# Patient Record
Sex: Female | Born: 1940
Health system: Southern US, Community
[De-identification: ages and names within clinical notes are randomized; demographics above are authoritative.]

## PROBLEM LIST (undated history)

## (undated) DIAGNOSIS — I499 Cardiac arrhythmia, unspecified: Secondary | ICD-10-CM

## (undated) DIAGNOSIS — I493 Ventricular premature depolarization: Secondary | ICD-10-CM

## (undated) DIAGNOSIS — I340 Nonrheumatic mitral (valve) insufficiency: Secondary | ICD-10-CM

## (undated) DIAGNOSIS — E039 Hypothyroidism, unspecified: Secondary | ICD-10-CM

## (undated) DIAGNOSIS — E785 Hyperlipidemia, unspecified: Secondary | ICD-10-CM

## (undated) HISTORY — DX: Ventricular premature depolarization: I49.3

## (undated) HISTORY — DX: Hyperlipidemia, unspecified: E78.5

## (undated) HISTORY — DX: Nonrheumatic mitral (valve) insufficiency: I34.0

## (undated) HISTORY — DX: Hypothyroidism, unspecified: E03.9

---

## 1999-08-31 ENCOUNTER — Encounter: Payer: Self-pay | Admitting: Family Medicine

## 1999-08-31 ENCOUNTER — Encounter: Admission: RE | Admit: 1999-08-31 | Discharge: 1999-08-31 | Payer: Self-pay | Admitting: Family Medicine

## 2000-07-11 ENCOUNTER — Encounter: Payer: Self-pay | Admitting: Family Medicine

## 2000-07-11 ENCOUNTER — Encounter: Admission: RE | Admit: 2000-07-11 | Discharge: 2000-07-11 | Payer: Self-pay | Admitting: Family Medicine

## 2002-04-13 ENCOUNTER — Encounter: Payer: Self-pay | Admitting: Obstetrics & Gynecology

## 2002-04-13 ENCOUNTER — Encounter: Admission: RE | Admit: 2002-04-13 | Discharge: 2002-04-13 | Payer: Self-pay | Admitting: Obstetrics & Gynecology

## 2003-11-23 ENCOUNTER — Encounter: Admission: RE | Admit: 2003-11-23 | Discharge: 2003-11-23 | Payer: Self-pay | Admitting: Family Medicine

## 2005-04-02 ENCOUNTER — Encounter: Admission: RE | Admit: 2005-04-02 | Discharge: 2005-04-02 | Payer: Self-pay | Admitting: Family Medicine

## 2006-05-08 ENCOUNTER — Encounter: Admission: RE | Admit: 2006-05-08 | Discharge: 2006-05-08 | Payer: Self-pay | Admitting: Family Medicine

## 2007-09-22 ENCOUNTER — Encounter: Admission: RE | Admit: 2007-09-22 | Discharge: 2007-09-22 | Payer: Self-pay | Admitting: Family Medicine

## 2010-04-27 ENCOUNTER — Other Ambulatory Visit: Payer: Self-pay | Admitting: Family Medicine

## 2010-04-27 DIAGNOSIS — Z1231 Encounter for screening mammogram for malignant neoplasm of breast: Secondary | ICD-10-CM

## 2010-05-09 ENCOUNTER — Ambulatory Visit
Admission: RE | Admit: 2010-05-09 | Discharge: 2010-05-09 | Disposition: A | Discharge status: Home / Self Care | Payer: MEDICARE | Source: Ambulatory Visit | Attending: Family Medicine | Admitting: Family Medicine

## 2010-05-09 DIAGNOSIS — Z1231 Encounter for screening mammogram for malignant neoplasm of breast: Secondary | ICD-10-CM

## 2013-03-11 ENCOUNTER — Other Ambulatory Visit: Payer: Self-pay

## 2013-03-11 DIAGNOSIS — Z1231 Encounter for screening mammogram for malignant neoplasm of breast: Secondary | ICD-10-CM

## 2013-03-30 ENCOUNTER — Ambulatory Visit
Admission: RE | Admit: 2013-03-30 | Discharge: 2013-03-30 | Disposition: A | Discharge status: Home / Self Care | Payer: Medicare Other | Source: Ambulatory Visit

## 2013-03-30 DIAGNOSIS — Z1231 Encounter for screening mammogram for malignant neoplasm of breast: Secondary | ICD-10-CM

## 2014-08-24 ENCOUNTER — Other Ambulatory Visit: Payer: Self-pay | Admitting: Family Medicine

## 2014-08-24 DIAGNOSIS — Z1231 Encounter for screening mammogram for malignant neoplasm of breast: Secondary | ICD-10-CM

## 2014-08-24 DIAGNOSIS — M8580 Other specified disorders of bone density and structure, unspecified site: Secondary | ICD-10-CM

## 2014-08-30 ENCOUNTER — Ambulatory Visit: Payer: Self-pay

## 2014-08-30 ENCOUNTER — Other Ambulatory Visit: Payer: Self-pay

## 2014-09-13 ENCOUNTER — Ambulatory Visit
Admission: RE | Admit: 2014-09-13 | Discharge: 2014-09-13 | Disposition: A | Discharge status: Home / Self Care | Payer: Medicare Other | Source: Ambulatory Visit | Attending: Family Medicine | Admitting: Family Medicine

## 2014-09-13 DIAGNOSIS — Z1231 Encounter for screening mammogram for malignant neoplasm of breast: Secondary | ICD-10-CM

## 2014-09-13 DIAGNOSIS — M8580 Other specified disorders of bone density and structure, unspecified site: Secondary | ICD-10-CM

## 2015-06-08 ENCOUNTER — Emergency Department (HOSPITAL_COMMUNITY): Payer: Medicare Other

## 2015-06-08 ENCOUNTER — Encounter (HOSPITAL_COMMUNITY): Payer: Self-pay | Admitting: Emergency Medicine

## 2015-06-08 DIAGNOSIS — Z8679 Personal history of other diseases of the circulatory system: Secondary | ICD-10-CM | POA: Insufficient documentation

## 2015-06-08 DIAGNOSIS — R002 Palpitations: Secondary | ICD-10-CM | POA: Insufficient documentation

## 2015-06-08 LAB — BASIC METABOLIC PANEL
Anion gap: 11 (ref 5–15)
BUN: 15 mg/dL (ref 6–20)
CHLORIDE: 107 mmol/L (ref 101–111)
CO2: 22 mmol/L (ref 22–32)
CREATININE: 0.99 mg/dL (ref 0.44–1.00)
Calcium: 9.2 mg/dL (ref 8.9–10.3)
GFR, EST NON AFRICAN AMERICAN: 55 mL/min — AB (ref >60)
Glucose, Bld: 113 mg/dL — ABNORMAL HIGH (ref 65–99)
Potassium: 3.8 mmol/L (ref 3.5–5.1)
SODIUM: 140 mmol/L (ref 135–145)

## 2015-06-08 LAB — CBC
HCT: 42.7 % (ref 36.0–46.0)
Hemoglobin: 13.9 g/dL (ref 12.0–15.0)
MCH: 30.8 pg (ref 26.0–34.0)
MCHC: 32.6 g/dL (ref 30.0–36.0)
MCV: 94.5 fL (ref 78.0–100.0)
PLATELETS: 315 10*3/uL (ref 150–400)
RBC: 4.52 MIL/uL (ref 3.87–5.11)
RDW: 13.4 % (ref 11.5–15.5)
WBC: 7.3 10*3/uL (ref 4.0–10.5)

## 2015-06-08 LAB — I-STAT TROPONIN, ED: TROPONIN I, POC: 0 ng/mL (ref 0.00–0.08)

## 2015-06-08 NOTE — ED Notes (Signed)
Pt. reports palpitations / " skipping beats " with mild SOB onset today , denies chest pain , no nausea or diaphoresis .

## 2015-06-09 ENCOUNTER — Emergency Department (HOSPITAL_COMMUNITY)
Admission: EM | Admit: 2015-06-09 | Discharge: 2015-06-09 | Disposition: A | Discharge status: Home / Self Care | Payer: Medicare Other | Attending: Emergency Medicine | Admitting: Emergency Medicine

## 2015-06-09 DIAGNOSIS — R002 Palpitations: Secondary | ICD-10-CM

## 2015-06-09 HISTORY — DX: Cardiac arrhythmia, unspecified: I49.9

## 2015-06-09 LAB — MAGNESIUM: Magnesium: 2.3 mg/dL (ref 1.7–2.4)

## 2015-06-09 LAB — I-STAT TROPONIN, ED: TROPONIN I, POC: 0 ng/mL (ref 0.00–0.08)

## 2015-06-09 NOTE — ED Provider Notes (Signed)
CSN: [id number]     Arrival date & time 06/08/15  2010 History  By signing my name below, I, Doreatha Martin, attest that this documentation has been prepared under the direction and in the presence of Tomasita Crumble, MD. Electronically Signed: Doreatha Martin, ED Scribe. 06/09/2015. 1:04 AM.    Chief Complaint  Patient presents with  . Palpitations   The history is provided by the patient. No language interpreter was used.   HPI Comments: Paige Carrillo is a 75 y.o. female with h/o irregular heart rhythm, asthma who presents to the Emergency Department complaining of an episode of rapid, irregular HR that began at 4:30PM while at rest. Per pt, she is currently asymptomatic. Pt states long h/o similar episodes and has had full work up with no acute findings. She states that her symptoms were previously exacerbated by caffeine use and she no longer consumes caffeine. She states she is followed by cardiology. Denies CP, SOB, nausea, lightheadedness, back pain, arm pain, jaw pain, emesis, diarrhea, appetite change.   Past Medical History  Diagnosis Date  . Irregular heart rhythm    History reviewed. No pertinent past surgical history. No family history on file. Social History  Substance Use Topics  . Smoking status: Never Smoker   . Smokeless tobacco: None  . Alcohol Use: No   OB History    No data available     Review of Systems A complete 10 system review of systems was obtained and all systems are negative except as noted in the HPI and PMH.    Allergies  Review of patient's allergies indicates no known allergies.  Home Medications   Prior to Admission medications   Not on File   BP 143/67 mmHg  Pulse 87  Temp(Src) 98.1 F (36.7 C) (Oral)  Resp 20  Ht  (1.676 m)  Wt 174 lb 3 oz (79.011 kg)  BMI 28.13 kg/m2  SpO2 98% Physical Exam  Constitutional: She is oriented to person, place, and time. She appears well-developed and well-nourished. No distress.  HENT:  Head:  Normocephalic and atraumatic.  Nose: Nose normal.  Mouth/Throat: Oropharynx is clear and moist. No oropharyngeal exudate.  Eyes: Conjunctivae and EOM are normal. Pupils are equal, round, and reactive to light. No scleral icterus.  Neck: Normal range of motion. Neck supple. No JVD present. No tracheal deviation present. No thyromegaly present.  Cardiovascular: Normal rate, regular rhythm and normal heart sounds.  Exam reveals no gallop and no friction rub.   No murmur heard. Pulmonary/Chest: Effort normal and breath sounds normal. No respiratory distress. She has no wheezes. She exhibits no tenderness.  Abdominal: Soft. Bowel sounds are normal. She exhibits no distension and no mass. There is no tenderness. There is no rebound and no guarding.  Musculoskeletal: Normal range of motion. She exhibits no edema or tenderness.  Lymphadenopathy:    She has no cervical adenopathy.  Neurological: She is alert and oriented to person, place, and time. No cranial nerve deficit. She exhibits normal muscle tone.  Skin: Skin is warm and dry. No rash noted. No erythema. No pallor.  Nursing note and vitals reviewed.   ED Course  Procedures (including critical care time) DIAGNOSTIC STUDIES: Oxygen Saturation is 98% on RA, normal by my interpretation.    COORDINATION OF CARE: 1:03 AM Discussed treatment plan with pt at bedside which includes lab work, CXR, EKG and pt agreed to plan.   Labs Review Labs Reviewed  BASIC METABOLIC PANEL - Abnormal; Notable  for the following:    Glucose, Bld 113 (*)    GFR calc non Af Amer 55 (*)    All other components within normal limits  CBC  MAGNESIUM  I-STAT TROPOININ, ED  Rosezena SensorI-STAT TROPOININ, ED    Imaging Review Dg Chest 2 View  06/08/2015  CLINICAL DATA:  Palpitations.  Dyspnea.  Onset this afternoon. EXAM: CHEST  2 VIEW COMPARISON:  None. FINDINGS: Mild curvilinear scarring is present in the bases. The lungs are otherwise clear. The pulmonary vasculature is  normal. There are no pleural effusions. Heart size is normal. Hilar and mediastinal contours are unremarkable. IMPRESSION: No active cardiopulmonary disease. Electronically Signed   By: Ellery Plunkaniel R Mitchell M.D.   On: 06/08/2015 20:53   I have personally reviewed and evaluated these images and lab results as part of my medical decision-making.   EKG Interpretation   Date/Time:  Wednesday June 08 2015 20:24:23 EDT Ventricular Rate:  91 PR Interval:  194 QRS Duration: 74 QT Interval:  330 QTC Calculation: 405 R Axis:   34 Text Interpretation:  Sinus rhythm with Premature atrial complexes  Otherwise normal ECG Confirmed by Fayrene FearingJAMES  MD, MARK ([id number]) on 06/09/2015  12:33:48 AM      MDM   Final diagnoses:  None   Patient presents to the ED for palpitations that occurred at rest and is relieved with walking.  She has had no recurrence in her symptoms. She states it has resolved since waiting in the ED waiting room for severeal hours.  Will repeat troponin and EKG and also check magnesium.  She has an appointment in 5 days for a physical and was encouraged to bring this up.  1:18 AM K and Mg are within normal limits.  Repeat troponin and EKG are unremarkable.  PCP fu advised.  HEART score is 2 for age.  She appears well and in NAD.  Vs remain within her normal limits and she is safe for DC.   I personally performed the services described in this documentation, which was scribed in my presence. The recorded information has been reviewed and is accurate.     Tomasita CrumbleAdeleke Domanick Cuccia, MD 06/09/15 [phone]

## 2015-06-09 NOTE — Discharge Instructions (Signed)
Palpitations Paige Carrillo, your blood work and EKG did not show a cause for your palpitations.  Please see your primary care doctor during your appointment on Monday and inform them on this.  If any symptoms worsen, come back to the ED immediately. Thank you. A palpitation is the feeling that your heartbeat is irregular. It may feel like your heart is fluttering or skipping a beat. It may also feel like your heart is beating faster than normal. This is usually not a serious problem. In some cases, you may need more medical tests. HOME CARE  Avoid:  Caffeine in coffee, tea, soft drinks, diet pills, and energy drinks.  Chocolate.  Alcohol.  Stop smoking if you smoke.  Reduce your stress and anxiety. Try:  A method that measures bodily functions so you can learn to control them (biofeedback).  Yoga.  Meditation.  Physical activity such as swimming, jogging, or walking.  Get plenty of rest and sleep. GET HELP IF:  Your fast or irregular heartbeat continues after 24 hours.  Your palpitations occur more often. GET HELP RIGHT AWAY IF:   You have chest pain.  You feel short of breath.  You have a very bad headache.  You feel dizzy or pass out (faint). MAKE SURE YOU:   Understand these instructions.  Will watch your condition.  Will get help right away if you are not doing well or get worse.   This information is not intended to replace advice given to you by your health care provider. Make sure you discuss any questions you have with your health care provider.   Document Released: 11/29/2007 Document Revised: 03/12/2014 Document Reviewed: 04/20/2011 Elsevier Interactive Patient Education [employer]2016 Elsevier Inc.

## 2016-06-30 ENCOUNTER — Emergency Department (HOSPITAL_COMMUNITY)
Admission: EM | Admit: 2016-06-30 | Discharge: 2016-06-30 | Disposition: A | Discharge status: Home / Self Care | Payer: Medicare Other | Attending: Emergency Medicine | Admitting: Emergency Medicine

## 2016-06-30 ENCOUNTER — Encounter (HOSPITAL_COMMUNITY): Payer: Self-pay | Admitting: *Deleted

## 2016-06-30 DIAGNOSIS — Z79899 Other long term (current) drug therapy: Secondary | ICD-10-CM | POA: Insufficient documentation

## 2016-06-30 DIAGNOSIS — Y929 Unspecified place or not applicable: Secondary | ICD-10-CM | POA: Diagnosis not present

## 2016-06-30 DIAGNOSIS — Z23 Encounter for immunization: Secondary | ICD-10-CM | POA: Diagnosis not present

## 2016-06-30 DIAGNOSIS — Y939 Activity, unspecified: Secondary | ICD-10-CM | POA: Insufficient documentation

## 2016-06-30 DIAGNOSIS — Y999 Unspecified external cause status: Secondary | ICD-10-CM | POA: Insufficient documentation

## 2016-06-30 DIAGNOSIS — S61452A Open bite of left hand, initial encounter: Secondary | ICD-10-CM | POA: Insufficient documentation

## 2016-06-30 DIAGNOSIS — W5501XA Bitten by cat, initial encounter: Secondary | ICD-10-CM | POA: Insufficient documentation

## 2016-06-30 LAB — COMPREHENSIVE METABOLIC PANEL
ALK PHOS: 63 U/L (ref 38–126)
ALT: 20 U/L (ref 14–54)
ANION GAP: 9 (ref 5–15)
AST: 21 U/L (ref 15–41)
Albumin: 3.8 g/dL (ref 3.5–5.0)
BUN: 21 mg/dL — ABNORMAL HIGH (ref 6–20)
CALCIUM: 9.2 mg/dL (ref 8.9–10.3)
CO2: 21 mmol/L — AB (ref 22–32)
CREATININE: 0.81 mg/dL (ref 0.44–1.00)
Chloride: 106 mmol/L (ref 101–111)
GFR calc Af Amer: >60 mL/min (ref >60)
Glucose, Bld: 95 mg/dL (ref 65–99)
Potassium: 4 mmol/L (ref 3.5–5.1)
SODIUM: 136 mmol/L (ref 135–145)
TOTAL PROTEIN: 6.4 g/dL — AB (ref 6.5–8.1)
Total Bilirubin: 0.5 mg/dL (ref 0.3–1.2)

## 2016-06-30 LAB — CBC
HCT: 40.7 % (ref 36.0–46.0)
HEMOGLOBIN: 13.5 g/dL (ref 12.0–15.0)
MCH: 31.8 pg (ref 26.0–34.0)
MCHC: 33.2 g/dL (ref 30.0–36.0)
MCV: 95.8 fL (ref 78.0–100.0)
PLATELETS: 282 10*3/uL (ref 150–400)
RBC: 4.25 MIL/uL (ref 3.87–5.11)
RDW: 13.3 % (ref 11.5–15.5)
WBC: 11.9 10*3/uL — AB (ref 4.0–10.5)

## 2016-06-30 LAB — I-STAT CG4 LACTIC ACID, ED: LACTIC ACID, VENOUS: 0.63 mmol/L (ref 0.5–1.9)

## 2016-06-30 MED ORDER — AMOXICILLIN-POT CLAVULANATE 875-125 MG PO TABS: 1.0000 | ORAL_TABLET | Freq: Two times a day (BID) | ORAL | 0 refills | Status: DC

## 2016-06-30 MED ORDER — TETANUS-DIPHTH-ACELL PERTUSSIS 5-2.5-18.5 LF-MCG/0.5 IM SUSP
0.5000 mL | Freq: Once | INTRAMUSCULAR | Status: AC
  Administered 2016-06-30: 0.5 mL via INTRAMUSCULAR
  Filled 2016-06-30: qty 0.5

## 2016-06-30 MED ORDER — SODIUM CHLORIDE 0.9 % IV SOLN
3.0000 g | Freq: Once | INTRAVENOUS | Status: AC
  Administered 2016-06-30: 3 g via INTRAVENOUS
  Filled 2016-06-30: qty 3

## 2016-06-30 NOTE — ED Notes (Signed)
Denies fever, nausea, vomiting, diarrhea, etc.

## 2016-06-30 NOTE — ED Provider Notes (Signed)
[facility]-EMERGENCY DEPT Provider Note   CSN: [id number] Arrival date & time: 06/30/16  1630     History   Chief Complaint Chief Complaint  Patient presents with  . Animal Bite    HPI Paige Carrillo is a 76 y.o. female. 39 female. Was bitten by her cat last night on her left hand at the base of her thumb. Has redness extending up her arm today and presents here.  He states it is throbbing and sore. She can move her thumb and hand without difficulty. No fevers or chills. She is not diabetic. She is not immune suppressed.  HPI  Past Medical History:  Diagnosis Date  . Irregular heart rhythm     There are no active problems to display for this patient.   History reviewed. No pertinent surgical history.  OB History    No data available       Home Medications    Prior to Admission medications   Medication Sig Start Date End Date Taking? Authorizing Provider  amoxicillin-clavulanate (AUGMENTIN) 875-125 MG tablet Take 1 tablet by mouth 2 (two) times daily. 06/30/16   Rolland Porter, MD  Fluticasone-Salmeterol (ADVAIR) 250-50 MCG/DOSE AEPB Inhale 1 puff into the lungs 2 (two) times daily.    Historical Provider, MD  levothyroxine (SYNTHROID, LEVOTHROID) 112 MCG tablet Take 112 mcg by mouth daily. 03/25/15   Historical Provider, MD  PROAIR HFA 108 (90 Base) MCG/ACT inhaler Inhale 1-2 puffs into the lungs every 4 (four) hours as needed. 03/08/15   Historical Provider, MD    Family History No family history on file.  Social History Social History  Substance Use Topics  . Smoking status: Never Smoker  . Smokeless tobacco: Never Used  . Alcohol use No     Allergies   Darvon [propoxyphene]   Review of Systems Review of Systems  Constitutional: Negative for appetite change, chills, diaphoresis, fatigue and fever.  HENT: Negative for mouth sores, sore throat and trouble swallowing.   Eyes: Negative for visual disturbance.  Respiratory: Negative for cough, chest  tightness, shortness of breath and wheezing.   Cardiovascular: Negative for chest pain.  Gastrointestinal: Negative for abdominal distention, abdominal pain, diarrhea, nausea and vomiting.  Endocrine: Negative for polydipsia, polyphagia and polyuria.  Genitourinary: Negative for dysuria, frequency and hematuria.  Musculoskeletal: Negative for gait problem.  Skin: Negative for color change, pallor and rash.       Bite left hand with red streaks  Neurological: Negative for dizziness, syncope, light-headedness and headaches.  Hematological: Does not bruise/bleed easily.  Psychiatric/Behavioral: Negative for behavioral problems and confusion.     Physical Exam Updated Vital Signs BP (!) 151/70   Pulse 79   Temp 99.5 F (37.5 C) (Oral)   Resp 18   SpO2 96%   Physical Exam  Constitutional: She is oriented to person, place, and time. She appears well-developed and well-nourished. No distress.  HENT:  Head: Normocephalic.  Eyes: Conjunctivae are normal. Pupils are equal, round, and reactive to light. No scleral icterus.  Neck: Normal range of motion. Neck supple. No thyromegaly present.  Cardiovascular: Normal rate and regular rhythm.  Exam reveals no gallop and no friction rub.   No murmur heard. Pulmonary/Chest: Effort normal and breath sounds normal. No respiratory distress. She has no wheezes. She has no rales.  Abdominal: Soft. Bowel sounds are normal. She exhibits no distension. There is no tenderness. There is no rebound.  Musculoskeletal: Normal range of motion.  Exam the left hand shows puncture  wounds on the dorsum of the hand just proximal to the base of the thumb and MTP. No obvious focal soft tissue swelling to suggest abscess. There is some surrounding cellulitis for several centimeters. There is a lymphangitic streak to just a few centimeters above her left elbow. This does not extend to the axilla. She is not febrile. She is not tachycardic.  Neurological: She is alert and  oriented to person, place, and time.  Skin: Skin is warm and dry. No rash noted.  Psychiatric: She has a normal mood and affect. Her behavior is normal.     ED Treatments / Results  Labs (all labs ordered are listed, but only abnormal results are displayed) Labs Reviewed  COMPREHENSIVE METABOLIC PANEL - Abnormal; Notable for the following:       Result Value   CO2 21 (*)    BUN 21 (*)    Total Protein 6.4 (*)    All other components within normal limits  CBC - Abnormal; Notable for the following:    WBC 11.9 (*)    All other components within normal limits  I-STAT CG4 LACTIC ACID, ED  I-STAT CG4 LACTIC ACID, ED    EKG  EKG Interpretation None       Radiology No results found.  Procedures Procedures (including critical care time)  Medications Ordered in ED Medications  Ampicillin-Sulbactam (UNASYN) 3 g in sodium chloride 0.9 % 100 mL IVPB (0 g Intravenous Stopped 06/30/16 1917)  Tdap (BOOSTRIX) injection 0.5 mL (0.5 mLs Intramuscular Given 06/30/16 1836)     Initial Impression / Assessment and Plan / ED Course  I have reviewed the triage vital signs and the nursing notes.  Pertinent labs & imaging results that were available during my care of the patient were reviewed by me and considered in my medical decision making (see chart for details).     I recommended IV antibiotics. We discussed treatment at home versus hospitalization. She has a very strong desire not to be hospitalized. I think this is not unreasonable. I discussed with her the Bites can improve quickly but also can progress quickly as hers has in the first 24 hours. I'll discharge her on Augmentin and have advised her to recheck here tomorrow morning if not showing improvement or with any worsening over the course of the next 24 hours including fever or proximal spread a rather symptoms. She expresses strong understanding and agrees to return if not improving or if worse  Final Clinical Impressions(s) /  ED Diagnoses   Final diagnoses:  Cat bite, initial encounter    New Prescriptions Discharge Medication List as of 06/30/2016  7:49 PM    START taking these medications   Details  amoxicillin-clavulanate (AUGMENTIN) 875-125 MG tablet Take 1 tablet by mouth 2 (two) times daily., Starting Sat 06/30/2016, Print         Rolland Porter, MD 06/30/16 2253

## 2016-06-30 NOTE — ED Triage Notes (Signed)
The pt was bitten by her cat last pm  She had pain all night.  The bites to her lt hand wrist and a-c red swollen and a red streak has run from the hand up into  Her elbow she was [facility]

## 2016-06-30 NOTE — Discharge Instructions (Signed)
Recheck here in 24 hours if not markedly improved.  Return here at any time with worsening

## 2016-07-06 ENCOUNTER — Emergency Department (HOSPITAL_COMMUNITY): Payer: Medicare Other

## 2016-07-06 ENCOUNTER — Emergency Department (HOSPITAL_COMMUNITY)
Admission: EM | Admit: 2016-07-06 | Discharge: 2016-07-07 | Disposition: A | Discharge status: Home / Self Care | Payer: Medicare Other | Attending: Emergency Medicine | Admitting: Emergency Medicine

## 2016-07-06 ENCOUNTER — Other Ambulatory Visit: Payer: Self-pay

## 2016-07-06 ENCOUNTER — Encounter (HOSPITAL_COMMUNITY): Payer: Self-pay | Admitting: *Deleted

## 2016-07-06 DIAGNOSIS — Z79899 Other long term (current) drug therapy: Secondary | ICD-10-CM | POA: Diagnosis not present

## 2016-07-06 DIAGNOSIS — R42 Dizziness and giddiness: Secondary | ICD-10-CM | POA: Diagnosis not present

## 2016-07-06 DIAGNOSIS — R079 Chest pain, unspecified: Secondary | ICD-10-CM | POA: Diagnosis present

## 2016-07-06 DIAGNOSIS — R0789 Other chest pain: Secondary | ICD-10-CM | POA: Insufficient documentation

## 2016-07-06 LAB — BASIC METABOLIC PANEL
Anion gap: 9 (ref 5–15)
BUN: 24 mg/dL — ABNORMAL HIGH (ref 6–20)
CALCIUM: 9.6 mg/dL (ref 8.9–10.3)
CO2: 23 mmol/L (ref 22–32)
Chloride: 106 mmol/L (ref 101–111)
Creatinine, Ser: 0.75 mg/dL (ref 0.44–1.00)
GFR calc Af Amer: >60 mL/min (ref >60)
GFR calc non Af Amer: >60 mL/min (ref >60)
GLUCOSE: 107 mg/dL — AB (ref 65–99)
Potassium: 4.1 mmol/L (ref 3.5–5.1)
Sodium: 138 mmol/L (ref 135–145)

## 2016-07-06 LAB — CBC
HCT: 41.7 % (ref 36.0–46.0)
HEMOGLOBIN: 13.8 g/dL (ref 12.0–15.0)
MCH: 31.6 pg (ref 26.0–34.0)
MCHC: 33.1 g/dL (ref 30.0–36.0)
MCV: 95.4 fL (ref 78.0–100.0)
PLATELETS: 340 10*3/uL (ref 150–400)
RBC: 4.37 MIL/uL (ref 3.87–5.11)
RDW: 13 % (ref 11.5–15.5)
WBC: 9.5 10*3/uL (ref 4.0–10.5)

## 2016-07-06 LAB — I-STAT TROPONIN, ED: TROPONIN I, POC: 0 ng/mL (ref 0.00–0.08)

## 2016-07-06 NOTE — ED Provider Notes (Signed)
By signing my name below, I, Talbert Nan, attest that this documentation has been prepared under the direction and in the presence of Ward, Layla Maw, DO. Electronically Signed: Talbert Nan, Scribe. 07/06/16. 11:41 PM.  TIME SEEN: 11:26 PM  CHIEF COMPLAINT: Chest pain  HPI: Paige Carrillo is a 76 y.o. female with h/o asthma, GERD, HLD who presents to the Emergency Department complaining of sudden onset, "wrong" feeling, moderate, resolved right sided chest pain that lasted 45 seconds at 5:45 pm today. Pt had associated dizziness she describes as a lightheadedness that resolved immediately. Pt has taken 2 ASA and antacids PTA with relief. Pt denies that anything exacerbated the pain. Pt states that she had not eaten anything prior to the pain. Pt denies "crushing" pain, SOB, nausea, vomiting, fever. Pt has a cat bite that is healing to the left hand and wrist that she was seen for on 06/30/16 and is on antibiotics. Pt has no h/o cardiac problems, but has had a stress test. Pt has no h/o blood clots in extremities or lungs. 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 Pt wet to cardiologist in April 2018 and was given "clean bill of health". Pt has been told to follow up with PCP next week. Pt states that she is feeling fine and is not is any pain at the moment. Pt is able to exercise (walks up to 3 hours) every day without being symptomatic.  ROS: See HPI Constitutional: no fever  Eyes: no drainage  ENT: no runny nose   Cardiovascular:  chest pain  Resp: no SOB  GI: no vomiting GU: no dysuria Integumentary: no rash  Allergy: no hives  Musculoskeletal: no leg swelling  Neurological: no slurred speech ROS otherwise negative  PAST MEDICAL HISTORY/PAST SURGICAL HISTORY:  Past Medical History:  Diagnosis Date  . Irregular heart rhythm     MEDICATIONS:  Prior to Admission medications   Medication Sig Start Date End Date Taking? Authorizing Provider  amoxicillin-clavulanate  (AUGMENTIN) 875-125 MG tablet Take 1 tablet by mouth 2 (two) times daily. 06/30/16   Rolland Porter, MD  Fluticasone-Salmeterol (ADVAIR) 250-50 MCG/DOSE AEPB Inhale 1 puff into the lungs 2 (two) times daily.    [provider]  levothyroxine (SYNTHROID, LEVOTHROID) 112 MCG tablet Take 112 mcg by mouth daily. 03/25/15   [provider]  PROAIR HFA 108 (90 Base) MCG/ACT inhaler Inhale 1-2 puffs into the lungs every 4 (four) hours as needed. 03/08/15   [provider]    ALLERGIES:  Allergies  Allergen Reactions  . Darvon [Propoxyphene] Other (See Comments)    "Felt weird"     SOCIAL HISTORY:  Social History  Substance Use Topics  . Smoking status: Never Smoker  . Smokeless tobacco: Never Used  . Alcohol use No    FAMILY HISTORY: No family history on file.  EXAM: BP (!) 152/60   Pulse 67   Temp 97.7 F (36.5 C) (Oral)   Resp 14   Ht 5\' 6"  (1.676 m)   Wt 179 lb (81.2 kg)   SpO2 100%   BMI 28.89 kg/m  CONSTITUTIONAL: Alert and oriented and responds appropriately to questions. Well-appearing; well-nourished HEAD: Normocephalic EYES: Conjunctivae clear, pupils appear equal, EOMI ENT: normal nose; moist mucous membranes NECK: Supple, no meningismus, no nuchal rigidity, no LAD  CARD: RRR; S1 and S2 appreciated; no murmurs, no clicks, no rubs, no gallops RESP: Normal chest excursion without splinting or tachypnea; breath sounds clear and equal bilaterally; no  wheezes, no rhonchi, no rales, no hypoxia or respiratory distress, speaking full sentences ABD/GI: Normal bowel sounds; non-distended; soft, non-tender, no rebound, no guarding, no peritoneal signs, no hepatosplenomegaly BACK:  The back appears normal and is non-tender to palpation, there is no CVA tenderness EXT: Normal ROM in all joints; non-tender to palpation; no edema; normal capillary refill; no cyanosis, no calf tenderness or swelling    SKIN: Normal color for age and race; warm; no rash NEURO:  Moves all extremities equally PSYCH: The patient's mood and manner are appropriate. Grooming and personal hygiene are appropriate.  MEDICAL DECISION MAKING: Patient here with atypical chest pain. Lasted for 45 seconds and then resolved. Had mild associated lightheadedness but states she has had this before. First set of labs unremarkable with negative troponin. Chest x-ray clear.  HEART score is 3.  Patient still asymptomatic. Plan is to repeat second troponin and reassess. Doubt dissection, PE given patient is symptomatically currently, hemodynamically stable. Patient's first EKG shows no ischemic abnormality at 6:58 PM.  ED PROGRESS: 12:10 AM  Pt is still asymptomatic. Second troponin negative. I feel she is safe for discharge home with close outpatient follow-up with her primary care provider. Patient comfortable with this plan.  At this time, I do not feel there is any life-threatening condition present. I have reviewed and discussed all results (EKG, imaging, lab, urine as appropriate) and exam findings with patient/family. I have reviewed nursing notes and appropriate previous records.  I feel the patient is safe to be discharged home without further emergent workup and can continue workup as an outpatient as needed. Discussed usual and customary return precautions. Patient/family verbalize understanding and are comfortable with this plan.  Outpatient follow-up has been provided if needed. All questions have been answered.    EKG Interpretation  Date/Time:  Friday Jul 06 2016 23:53:43 EDT Ventricular Rate:  61 PR Interval:    QRS Duration: 91 QT Interval:  393 QTC Calculation: 396 R Axis:   67 Text Interpretation:  Sinus rhythm Borderline prolonged PR interval Borderline T abnormalities, anterior leads No significant change since last tracing Confirmed by WARD,  DO, KRISTEN [phone] on 07/06/2016 11:56:44 PM        I personally performed the services described in this documentation, which  was scribed in my presence. The recorded information has been reviewed and is accurate.     Ward, Layla MawKristen N, DO 07/07/16 [phone]

## 2016-07-06 NOTE — ED Triage Notes (Signed)
States she was sitting in living room when she felt a sharp pain in right chest that moved through to her back. States it lasted 30-40 seconds. She took some antacids and asa and walked around her house. She noticed herself feeling dizzy so [facility] to [facility]. No pain or discomfort now. States she was in ED last weekend due to a cat bite after noticing red streaks up her arm. States she has had IV abx and 'heavy doses' of abx at home. Appears in nad. No sob.

## 2016-07-07 LAB — I-STAT TROPONIN, ED: Troponin i, poc: 0 ng/mL (ref 0.00–0.08)

## 2016-07-07 NOTE — Discharge Instructions (Signed)
Please schedule an appointment with your primary care physician at the beginning of next week.

## 2016-07-07 NOTE — ED Notes (Signed)
PT states understanding of care given. PT ambulated from ED to car with a steady gait. 

## 2016-10-26 ENCOUNTER — Other Ambulatory Visit: Payer: Self-pay | Admitting: Physician Assistant

## 2016-10-26 DIAGNOSIS — Z1231 Encounter for screening mammogram for malignant neoplasm of breast: Secondary | ICD-10-CM

## 2016-10-26 DIAGNOSIS — M8580 Other specified disorders of bone density and structure, unspecified site: Secondary | ICD-10-CM

## 2016-11-07 ENCOUNTER — Ambulatory Visit
Admission: RE | Admit: 2016-11-07 | Discharge: 2016-11-07 | Disposition: A | Discharge status: Home / Self Care | Payer: Medicare Other | Source: Ambulatory Visit | Attending: Physician Assistant | Admitting: Physician Assistant

## 2016-11-07 DIAGNOSIS — M8580 Other specified disorders of bone density and structure, unspecified site: Secondary | ICD-10-CM

## 2016-11-07 DIAGNOSIS — Z1231 Encounter for screening mammogram for malignant neoplasm of breast: Secondary | ICD-10-CM

## 2017-03-27 ENCOUNTER — Emergency Department (HOSPITAL_COMMUNITY)
Admission: EM | Admit: 2017-03-27 | Discharge: 2017-03-27 | Disposition: A | Discharge status: Home / Self Care | Payer: PPO | Attending: Emergency Medicine | Admitting: Emergency Medicine

## 2017-03-27 ENCOUNTER — Other Ambulatory Visit: Payer: Self-pay

## 2017-03-27 ENCOUNTER — Emergency Department (HOSPITAL_COMMUNITY): Payer: PPO

## 2017-03-27 ENCOUNTER — Encounter (HOSPITAL_COMMUNITY): Payer: Self-pay | Admitting: Emergency Medicine

## 2017-03-27 DIAGNOSIS — E785 Hyperlipidemia, unspecified: Secondary | ICD-10-CM | POA: Diagnosis not present

## 2017-03-27 DIAGNOSIS — R0789 Other chest pain: Secondary | ICD-10-CM | POA: Diagnosis not present

## 2017-03-27 DIAGNOSIS — Z79899 Other long term (current) drug therapy: Secondary | ICD-10-CM | POA: Insufficient documentation

## 2017-03-27 DIAGNOSIS — K219 Gastro-esophageal reflux disease without esophagitis: Secondary | ICD-10-CM | POA: Diagnosis not present

## 2017-03-27 DIAGNOSIS — J989 Respiratory disorder, unspecified: Secondary | ICD-10-CM | POA: Diagnosis not present

## 2017-03-27 DIAGNOSIS — R072 Precordial pain: Secondary | ICD-10-CM | POA: Diagnosis not present

## 2017-03-27 DIAGNOSIS — Z7982 Long term (current) use of aspirin: Secondary | ICD-10-CM | POA: Insufficient documentation

## 2017-03-27 DIAGNOSIS — R42 Dizziness and giddiness: Secondary | ICD-10-CM | POA: Diagnosis not present

## 2017-03-27 DIAGNOSIS — R079 Chest pain, unspecified: Secondary | ICD-10-CM | POA: Insufficient documentation

## 2017-03-27 LAB — BASIC METABOLIC PANEL
ANION GAP: 11 (ref 5–15)
BUN: 13 mg/dL (ref 6–20)
CALCIUM: 9.4 mg/dL (ref 8.9–10.3)
CO2: 22 mmol/L (ref 22–32)
Chloride: 106 mmol/L (ref 101–111)
Creatinine, Ser: 0.9 mg/dL (ref 0.44–1.00)
GLUCOSE: 122 mg/dL — AB (ref 65–99)
POTASSIUM: 4 mmol/L (ref 3.5–5.1)
SODIUM: 139 mmol/L (ref 135–145)

## 2017-03-27 LAB — CBC
HEMATOCRIT: 43.4 % (ref 36.0–46.0)
HEMOGLOBIN: 14.4 g/dL (ref 12.0–15.0)
MCH: 32 pg (ref 26.0–34.0)
MCHC: 33.2 g/dL (ref 30.0–36.0)
MCV: 96.4 fL (ref 78.0–100.0)
Platelets: 295 10*3/uL (ref 150–400)
RBC: 4.5 MIL/uL (ref 3.87–5.11)
RDW: 13 % (ref 11.5–15.5)
WBC: 6.9 10*3/uL (ref 4.0–10.5)

## 2017-03-27 LAB — I-STAT TROPONIN, ED: TROPONIN I, POC: 0.02 ng/mL (ref 0.00–0.08)

## 2017-03-27 MED ORDER — NITROGLYCERIN 0.4 MG SL SUBL: 0.4000 mg | SUBLINGUAL_TABLET | SUBLINGUAL | 0 refills | Status: AC | PRN

## 2017-03-27 NOTE — Discharge Instructions (Signed)
Your workup today did not definitively reveal a cardiac cause of your chest pain however, we did call cardiology.  Dr. Jacinto HalimGanji feel you are safe to go home with close outpatient follow-up as well as a prescription for nitroglycerin.  If you begin having any worsened symptoms, please return immediately to the nearest emergency department.  Please follow-up as directed.

## 2017-03-27 NOTE — H&P (Signed)
Paige Carrillo is an 77 y.o. female.   Chief Complaint: Chest pain HPI: Patient with chronic palpitations, has been on low-dose beta-blockers 3-4 years, hyperlipidemia, who has had a event 3 years ago and was found to have occasional PVCs has had a negative routine treadmill stress test in 2016.  She had been doing well until this morning around 10 AM, when walking the dog, she felt chest discomfort she describes as very mild in the left breast area.  It lasted for about 5 minutes with complete resolution.  Again she has had about 2-3 episodes without any exertional activity at home.  Due to this she just wanted to get it checked out and went to the urgent care and then she was advised to go to the emergency room.  Patient states that by the time she was evaluated around noon, she had complete resolution of symptoms, she got ready without any discomfort, and came to the emergency room.  She has not had any recurrence since then.  States the chest pain was very mild and describes it as discomfort and states that if she was busy she probably would not evaluate his no other associated symptoms, denies any shortness of breath, palpitations with the chest pain, dizziness or syncope.  She denies any leg edema.  She is very active.  Past Medical History:  Diagnosis Date  . Irregular heart rhythm     History reviewed. No pertinent surgical history.  No family history on file. Social History:  reports that  has never smoked. she has never used smokeless tobacco. She reports that she does not drink alcohol or use drugs.  Allergies:  Allergies  Allergen Reactions  . Darvon [Propoxyphene] Other (See Comments)    "Felt weird"    Results for orders placed or performed during the hospital encounter of 03/27/17 (from the past 48 hour(s))  Basic metabolic panel     Status: Abnormal   Collection Time: 03/27/17  3:18 PM  Result Value Ref Range   Sodium 139 135 - 145 mmol/L   Potassium 4.0 3.5 - 5.1  mmol/L   Chloride 106 101 - 111 mmol/L   CO2 22 22 - 32 mmol/L   Glucose, Bld 122 (H) 65 - 99 mg/dL   BUN 13 6 - 20 mg/dL   Creatinine, Ser 0.90 0.44 - 1.00 mg/dL   Calcium 9.4 8.9 - 10.3 mg/dL   GFR calc non Af Amer >60 >60 mL/min   GFR calc Af Amer >60 >60 mL/min    Comment: (NOTE) The eGFR has been calculated using the CKD EPI equation. This calculation has not been validated in all clinical situations. eGFR's persistently <60 mL/min signify possible Chronic Kidney Disease.    Anion gap 11 5 - 15  CBC     Status: None   Collection Time: 03/27/17  3:18 PM  Result Value Ref Range   WBC 6.9 4.0 - 10.5 K/uL   RBC 4.50 3.87 - 5.11 MIL/uL   Hemoglobin 14.4 12.0 - 15.0 g/dL   HCT 43.4 36.0 - 46.0 %   MCV 96.4 78.0 - 100.0 fL   MCH 32.0 26.0 - 34.0 pg   MCHC 33.2 30.0 - 36.0 g/dL   RDW 13.0 11.5 - 15.5 %   Platelets 295 150 - 400 K/uL  I-stat troponin, ED     Status: None   Collection Time: 03/27/17  3:35 PM  Result Value Ref Range   Troponin i, poc 0.02 0.00 - 0.08 ng/mL  Comment 3            Comment: Due to the release kinetics of cTnI, a negative result within the first hours of the onset of symptoms does not rule out myocardial infarction with certainty. If myocardial infarction is still suspected, repeat the test at appropriate intervals.    Dg Chest 2 View  Result Date: 03/27/2017 CLINICAL DATA:  Chest pain. EXAM: CHEST  2 VIEW COMPARISON:  07/06/2016 FINDINGS: The heart size and mediastinal contours are within normal limits. Both lungs are clear. The visualized skeletal structures are unremarkable. Aortic atherosclerosis. IMPRESSION: No active cardiopulmonary disease. Aortic Atherosclerosis (ICD10-I70.0). Electronically Signed   By: Lorriane Shire M.D.   On: 03/27/2017 16:31    Review of Systems  Constitutional: Negative.   HENT: Negative.   Eyes: Negative.   Respiratory: Negative.   Cardiovascular: Positive for chest pain. Negative for palpitations, orthopnea,  claudication, leg swelling and PND.  Gastrointestinal: Positive for heartburn (Occasional GERD).  Genitourinary: Negative.   Musculoskeletal: Positive for joint pain (arthritis).  Skin: Negative.   Neurological: Negative.   Endo/Heme/Allergies: Negative.   Psychiatric/Behavioral: Negative.   All other systems reviewed and are negative.   Blood pressure 137/62, pulse 85, temperature 98.3 F (36.8 C), temperature source Oral, resp. rate 18, SpO2 100 %. Physical Exam  Constitutional: She is oriented to person, place, and time. She appears well-developed and well-nourished. No distress.  HENT:  Head: Normocephalic and atraumatic.  Eyes: Conjunctivae are normal.  Neck: Normal range of motion. Neck supple.  Cardiovascular: Normal rate, regular rhythm, normal heart sounds and intact distal pulses. Exam reveals no gallop and no friction rub.  No murmur heard. Respiratory: Effort normal and breath sounds normal.  GI: Soft. Bowel sounds are normal.  Musculoskeletal: Normal range of motion. She exhibits no edema or deformity.  Neurological: She is alert and oriented to person, place, and time.  Skin: Skin is warm and dry. She is not diaphoretic.  Psychiatric: She has a normal mood and affect.     No current facility-administered medications on file prior to encounter.    Current Outpatient Medications on File Prior to Encounter  Medication Sig Dispense Refill  . amoxicillin-clavulanate (AUGMENTIN) 875-125 MG tablet Take 1 tablet by mouth 2 (two) times daily. 14 tablet 0  . aspirin EC 81 MG tablet Take 81 mg by mouth daily.    . cetirizine (ZYRTEC) 10 MG tablet Take 10 mg by mouth daily.    . famotidine (PEPCID) 20 MG tablet Take 20 mg by mouth as needed for heartburn or indigestion.    . Fluticasone-Salmeterol (ADVAIR) 250-50 MCG/DOSE AEPB Inhale 1 puff into the lungs 2 (two) times daily.    Marland Kitchen levothyroxine (SYNTHROID, LEVOTHROID) 112 MCG tablet Take 112 mcg by mouth daily.  0  .  metoprolol succinate (TOPROL-XL) 25 MG 24 hr tablet Take 25 mg by mouth daily.  6  . pravastatin (PRAVACHOL) 40 MG tablet Take 40 mg by mouth daily.    Marland Kitchen PROAIR HFA 108 (90 Base) MCG/ACT inhaler Inhale 1-2 puffs into the lungs every 4 (four) hours as needed.  0    Outpatient  echocardiogram. 05/11/2014 1. Left ventricle cavity is normal in size. Normal global wall motion. Doppler evidence of grade I (impaired) diastolic dysfunction. Calculated EF 57%. 2. Mild mitral regurgitation. Mild calcification of the mitral valve annulus. 3. Trace tricuspid regurgitation. 4. Insignificant pericardial effusion. Treadmill stress test. 05/14/2014 Indication: Dyspnea, abnormal EKG The patient exercised according to Bruce Protocol, Total  time recorded _ 4:47 min achieving max heart rate of 143- which was 96% of MPHR for age and 7.05 METS of work Normal BP response. During exercise there was no ST-T changes of ischemia. Rare PVC noted. Normal BP response.  Treadmill Exercise stress 05/14/2014: Indication: Dyspnea, abnormal EKG The patient exercised according to Bruce Protocol, Total time recorded _ 4:47 min achieving max heart rate of 143- which was 96% of MPHR for age and 7.05 METS of work Normal BP response. During exercise there was no ST-T changes of ischemia. Rare PVC noted. Normal BP response.  Assessment/Plan 1.  Atypical chest pain, no recurrence of chest pain, patient ambulated in the hallway for 10 minutes in the emergency room without any arrhythmias or recurrence of chest pain or any other symptoms. 2.  Hyperlipidemia presently on statin 3.  History of reactive airway disease, no active exacerbation presently. 4.  History of GERD, no recent exacerbation.  Recommendation: Patient's heart score is low, completely normal EKG, no recurrence of chest pain, hence she can be discharged home and can have outpatient evaluation.  I would recommend that she use nitroglycerin and I have advised  her how to use it.  Daughter will stay with her tonight.  She is advised not to get exposed to cold air tomorrow and will make arrangements for stress testing.  She is also given option of staying tonight all coming back to the emergency room if she has recurrence of chest pain.  She has my contact for our office.  She is agreeable.  She will continue with aspirin and also Pepcid.  Adrian Prows, MD 03/27/2017, 6:37 PM

## 2017-03-27 NOTE — ED Notes (Signed)
Pt ambulated on the hallway with steady gait, no distress noticed not c/o pain.

## 2017-03-27 NOTE — ED Provider Notes (Signed)
[facility] EMERGENCY DEPARTMENT Provider Note   CSN: [id number] Arrival date & time: 03/27/17  1508     History   Chief Complaint Chief Complaint  Patient presents with  . Chest Pain    HPI Paige Carrillo is a 77 y.o. female.  The history is provided by the patient and medical records. No language interpreter was used.  Chest Pain   This is a new problem. The current episode started 6 to 12 hours ago. The problem occurs hourly. The problem has been resolved. The pain is present in the substernal region and lateral region. The pain is at a severity of 3/10. The pain is mild. The quality of the pain is described as heavy and pressure-like. The pain radiates to the left shoulder and left arm. Duration of episode(s) is 10 minutes. Pertinent negatives include no abdominal pain, no back pain, no cough, no diaphoresis, no dizziness, no exertional chest pressure, no fever, no headaches, no irregular heartbeat, no leg pain, no lower extremity edema, no malaise/fatigue, no nausea, no near-syncope, no palpitations, no shortness of breath, no sputum production, no syncope, no vomiting and no weakness. She has tried nothing for the symptoms. The treatment provided no relief.    Past Medical History:  Diagnosis Date  . Irregular heart rhythm     There are no active problems to display for this patient.   History reviewed. No pertinent surgical history.  OB History    No data available       Home Medications    Prior to Admission medications   Medication Sig Start Date End Date Taking? Authorizing Provider  amoxicillin-clavulanate (AUGMENTIN) 875-125 MG tablet Take 1 tablet by mouth 2 (two) times daily. 06/30/16   Rolland Porter, MD  aspirin EC 81 MG tablet Take 81 mg by mouth daily.    [provider]  cetirizine (ZYRTEC) 10 MG tablet Take 10 mg by mouth daily.    [provider]  famotidine (PEPCID) 20 MG tablet Take 20 mg by mouth as needed  for heartburn or indigestion.    [provider]  Fluticasone-Salmeterol (ADVAIR) 250-50 MCG/DOSE AEPB Inhale 1 puff into the lungs 2 (two) times daily.    [provider]  levothyroxine (SYNTHROID, LEVOTHROID) 112 MCG tablet Take 112 mcg by mouth daily. 03/25/15   [provider]  metoprolol succinate (TOPROL-XL) 25 MG 24 hr tablet Take 25 mg by mouth daily. 06/24/16   [provider]  pravastatin (PRAVACHOL) 40 MG tablet Take 40 mg by mouth daily. 03/31/16   [provider]  PROAIR HFA 108 (90 Base) MCG/ACT inhaler Inhale 1-2 puffs into the lungs every 4 (four) hours as needed. 03/08/15   [provider]    Family History No family history on file.  Social History Social History   Tobacco Use  . Smoking status: Never Smoker  . Smokeless tobacco: Never Used  Substance Use Topics  . Alcohol use: No  . Drug use: No     Allergies   Darvon [propoxyphene]   Review of Systems Review of Systems  Constitutional: Negative for chills, diaphoresis, fatigue, fever and malaise/fatigue.  Respiratory: Negative for cough, sputum production, chest tightness, shortness of breath, wheezing and stridor.   Cardiovascular: Positive for chest pain. Negative for palpitations, leg swelling, syncope and near-syncope.  Gastrointestinal: Negative for abdominal pain, diarrhea, nausea and vomiting.  Genitourinary: Negative for dysuria, flank pain and frequency.  Musculoskeletal: Negative for back pain, neck pain and neck  stiffness.  Skin: Negative for wound.  Neurological: Positive for light-headedness. Negative for dizziness, weakness and headaches.  Psychiatric/Behavioral: Negative for agitation and confusion.  All other systems reviewed and are negative.    Physical Exam Updated Vital Signs BP (!) 122/42 (BP Location: Right Arm)   Pulse 85   Temp 98.3 F (36.8 C) (Oral)   Resp 16   SpO2 100%   Physical Exam  Constitutional: She is oriented to  person, place, and time. She appears well-developed and well-nourished.  Non-toxic appearance. She does not appear ill. No distress.  HENT:  Head: Normocephalic.  Mouth/Throat: Oropharynx is clear and moist. No oropharyngeal exudate.  Eyes: Conjunctivae and EOM are normal. Pupils are equal, round, and reactive to light.  Neck: Normal range of motion.  Cardiovascular: Normal rate and intact distal pulses.  No murmur heard. Pulmonary/Chest: No stridor. No respiratory distress. She has no wheezes. She has no rales. She exhibits no tenderness.  Abdominal: There is no tenderness. There is no guarding.  Musculoskeletal: She exhibits no edema or tenderness.  Neurological: She is alert and oriented to person, place, and time. No sensory deficit. She exhibits normal muscle tone.  Skin: Capillary refill takes less than 2 seconds. No rash noted. She is not diaphoretic. No erythema.  Psychiatric: She has a normal mood and affect.  Nursing note and vitals reviewed.    ED Treatments / Results  Labs (all labs ordered are listed, but only abnormal results are displayed) Labs Reviewed  BASIC METABOLIC PANEL - Abnormal; Notable for the following components:      Result Value   Glucose, Bld 122 (*)    All other components within normal limits  CBC  I-STAT TROPONIN, ED    EKG  EKG Interpretation  Date/Time:  Wednesday March 27 2017 15:13:54 EST Ventricular Rate:  86 PR Interval:  178 QRS Duration: 82 QT Interval:  342 QTC Calculation: 409 R Axis:   29 Text Interpretation:  Normal sinus rhythm Normal ECG When compared to prior, no significant changes seen.  No STEMI Confirmed by Theda Belfast ([id number]) on 03/27/2017 4:08:55 PM       Radiology Dg Chest 2 View  Result Date: 03/27/2017 CLINICAL DATA:  Chest pain. EXAM: CHEST  2 VIEW COMPARISON:  07/06/2016 FINDINGS: The heart size and mediastinal contours are within normal limits. Both lungs are clear. The visualized skeletal structures are  unremarkable. Aortic atherosclerosis. IMPRESSION: No active cardiopulmonary disease. Aortic Atherosclerosis (ICD10-I70.0). Electronically Signed   By: Francene Boyers M.D.   On: 03/27/2017 16:31    Procedures Procedures (including critical care time)  Medications Ordered in ED Medications - No data to display   Initial Impression / Assessment and Plan / ED Course  I have reviewed the triage vital signs and the nursing notes.  Pertinent labs & imaging results that were available during my care of the patient were reviewed by me and considered in my medical decision making (see chart for details).     Paige Carrillo is a 77 y.o. female past medical history significant for prior cardiac arrhythmia on metoprolol who presents for chest pain.  Patient reports that at 10 AM this morning, after having a long walk of her dog, she began having some chest pressure.  She describes it as left-sided and radiating into her left arm.  She reports it is intermittent in nature.  She reports that she had some lightheadedness associated with it but no nausea, vomiting, palpitations, or diaphoresis.  She reports  that her father had heart disease in his 3260s with MI.  She denies recent traumatic injuries or any recent heavy lifting.  She denies any pleuritic component, exertional component, leg pain or leg swelling.  No history of prior MI or DVT/PE.  She reports that she quit smoking 30 years ago.  She denies other complaints at this time.  On exam, patient has no chest tenderness.  Lungs are clear.  No edema seen in the legs.  Patient had symmetric pulses in upper and lower extremities.  Patient was alert and oriented x3.  EKG showed no evidence of STEMI.  Patient will have laboratory testing performed.  Initial troponin negative.    Based on patient's description of left chest pressure rating into her left arm at 77 years old, cardiology will be called for evaluation.  Heart score  found to be 3.       Dr. Jacinto HalimGanji with cardiology came to see the patient.  He feels that she has not having cardiac chest pain and feels she is safe for discharge home.  He requests that patient be given prescription for sublingual nitroglycerin and he will see her in clinic in several days.  He agrees with strong return precautions.  Patient denied any further chest pain in the ED and agreed with plan of care.  Patient will be discharged with understanding of plan of care and follow-up instructions.  Patient discharged in good condition.   Final Clinical Impressions(s) / ED Diagnoses   Final diagnoses:  Nonspecific chest pain    ED Discharge Orders        Ordered    nitroGLYCERIN (NITROSTAT) 0.4 MG SL tablet  Every 5 min PRN     03/27/17 1933     Clinical Impression: 1. Nonspecific chest pain     Disposition: Discharge  Condition: Good  I have discussed the results, Dx and Tx plan with the pt(& family if present). He/she/they expressed understanding and agree(s) with the plan. Discharge instructions discussed at great length. Strict return precautions discussed and pt &/or family have verbalized understanding of the instructions. No further questions at time of discharge.    Discharge Medication List as of 03/27/2017  7:35 PM    START taking these medications   Details  nitroGLYCERIN (NITROSTAT) 0.4 MG SL tablet Place 1 tablet (0.4 mg total) under the tongue every 5 (five) minutes as needed for chest pain (up to 3 times)., Starting Wed 03/27/2017, Print        Follow Up: Yates DecampGanji, Jay, MD [address] [place] [address] [phone]     [facility] [address] [id number] [place] [id number] [phone]  If symptoms worsen     Tegeler, Canary Brimhristopher J, MD 03/28/17 1359

## 2017-03-27 NOTE — ED Triage Notes (Signed)
Pt to ER for evaluation of left chest radiating to left arm and back, described as pressure. States onset at 10 am, states intermittent in nature, but has began lasting longer each time. Pt in NAD. Reports light headed. Pt a/o x4.

## 2017-03-29 DIAGNOSIS — R0789 Other chest pain: Secondary | ICD-10-CM | POA: Diagnosis not present

## 2017-04-02 DIAGNOSIS — E785 Hyperlipidemia, unspecified: Secondary | ICD-10-CM | POA: Diagnosis not present

## 2017-04-02 DIAGNOSIS — R002 Palpitations: Secondary | ICD-10-CM | POA: Diagnosis not present

## 2017-04-02 DIAGNOSIS — I493 Ventricular premature depolarization: Secondary | ICD-10-CM | POA: Diagnosis not present

## 2017-04-09 DIAGNOSIS — E038 Other specified hypothyroidism: Secondary | ICD-10-CM | POA: Diagnosis not present

## 2017-04-09 DIAGNOSIS — R7301 Impaired fasting glucose: Secondary | ICD-10-CM | POA: Diagnosis not present

## 2017-04-09 DIAGNOSIS — J4520 Mild intermittent asthma, uncomplicated: Secondary | ICD-10-CM | POA: Diagnosis not present

## 2017-04-09 DIAGNOSIS — E063 Autoimmune thyroiditis: Secondary | ICD-10-CM | POA: Diagnosis not present

## 2017-06-04 DIAGNOSIS — H2513 Age-related nuclear cataract, bilateral: Secondary | ICD-10-CM | POA: Diagnosis not present

## 2017-06-06 DIAGNOSIS — I493 Ventricular premature depolarization: Secondary | ICD-10-CM | POA: Diagnosis not present

## 2017-06-06 DIAGNOSIS — E785 Hyperlipidemia, unspecified: Secondary | ICD-10-CM | POA: Diagnosis not present

## 2017-06-10 DIAGNOSIS — R002 Palpitations: Secondary | ICD-10-CM | POA: Diagnosis not present

## 2017-06-10 DIAGNOSIS — E785 Hyperlipidemia, unspecified: Secondary | ICD-10-CM | POA: Diagnosis not present

## 2017-06-10 DIAGNOSIS — I493 Ventricular premature depolarization: Secondary | ICD-10-CM | POA: Diagnosis not present

## 2017-06-10 DIAGNOSIS — Z0189 Encounter for other specified special examinations: Secondary | ICD-10-CM | POA: Diagnosis not present

## 2017-10-08 DIAGNOSIS — Z0000 Encounter for general adult medical examination without abnormal findings: Secondary | ICD-10-CM | POA: Diagnosis not present

## 2017-10-08 DIAGNOSIS — J4530 Mild persistent asthma, uncomplicated: Secondary | ICD-10-CM | POA: Diagnosis not present

## 2017-10-08 DIAGNOSIS — E782 Mixed hyperlipidemia: Secondary | ICD-10-CM | POA: Diagnosis not present

## 2017-10-08 DIAGNOSIS — E038 Other specified hypothyroidism: Secondary | ICD-10-CM | POA: Diagnosis not present

## 2017-10-08 DIAGNOSIS — E063 Autoimmune thyroiditis: Secondary | ICD-10-CM | POA: Diagnosis not present

## 2017-12-11 DIAGNOSIS — E063 Autoimmune thyroiditis: Secondary | ICD-10-CM | POA: Diagnosis not present

## 2017-12-11 DIAGNOSIS — E038 Other specified hypothyroidism: Secondary | ICD-10-CM | POA: Diagnosis not present

## 2018-06-16 ENCOUNTER — Ambulatory Visit: Payer: Self-pay | Admitting: Cardiology

## 2018-07-24 ENCOUNTER — Other Ambulatory Visit: Payer: Self-pay

## 2018-07-24 MED ORDER — METOPROLOL SUCCINATE ER 25 MG PO TB24: 25.0000 mg | ORAL_TABLET | Freq: Every day | ORAL | 0 refills | Status: DC

## 2018-08-02 DIAGNOSIS — E7800 Pure hypercholesterolemia, unspecified: Secondary | ICD-10-CM | POA: Insufficient documentation

## 2018-08-02 DIAGNOSIS — I493 Ventricular premature depolarization: Secondary | ICD-10-CM | POA: Insufficient documentation

## 2018-08-02 NOTE — Progress Notes (Signed)
Subjective:  Primary Physician:  Gwenlyn Found, MD  This visit type was conducted due to national recommendations for restrictions regarding the COVID-19 Pandemic (e.g. social distancing).  This format is felt to be most appropriate for this patient at this time.  All issues noted in this document were discussed and addressed.  No physical exam was performed (except for noted visual exam findings with Telehealth visits - very limited).  The patient has consented to conduct a Telehealth visit and understands insurance will be billed.   I connected with patient, on 08/04/18  by telemedicine application and verified that I am speaking with the correct person using two identifiers.     I discussed the limitations of evaluation and management by telemedicine and the availability of in person appointments. The patient expressed understanding and agreed to proceed.   I have discussed with patient regarding the safety during COVID Pandemic and steps and precautions including social distancing with the patient.    Patient ID: Paige Carrillo, female    DOB: 1940-10-27, 78 y.o.   MRN: [id number]  Chief Complaint  Patient presents with  . Palpitations  . Follow-up    HPI: Paige Carrillo  is a 78 y.o. female. Patient has history of PVCs. She is feeling well, denies any chest pain. She also does not have any palpitation, patient says that since the metoprolol succinate was changed to metoprolol tartrate, she has felt much better.  No c/o dyspnea except when she has asthma. No orthopnea or PND. No history of swelling on the legs.  No history of hypertension or diabetes. She has hyperlipidemia and patient is following diet well. She took high dose pravastatin in the past but had leg cramps, currently taking 40 mg daily and is tolerating it well. She is also taking ezetimibe.There are no muscle pains or cramps. She does not smoke. She walks for about 3 hours every day (approximately  3 miles daily).  Patient had graves disease and was treated with radioiodine in 1978. She now has hypothyroidism and is getting levothyroxine therapy.  No history of TIA or CVA. She has history of nasal allergies and asthma.  She does not drink caffeine-containing beverages and does not take any decongestant medications.   Past Medical History:  Diagnosis Date  . Irregular heart rhythm     No past surgical history on file.  Social History   Socioeconomic History  . Marital status: Divorced    Spouse name: Not on file  . Number of children: 2  . Years of education: Not on file  . Highest education level: Not on file  Occupational History  . Not on file  Social Needs  . Financial resource strain: Not on file  . Food insecurity:    Worry: Not on file    Inability: Not on file  . Transportation needs:    Medical: Not on file    Non-medical: Not on file  Tobacco Use  . Smoking status: Former Games developer  . Smokeless tobacco: Never Used  Substance and Sexual Activity  . Alcohol use: No  . Drug use: No  . Sexual activity: Not on file  Lifestyle  . Physical activity:    Days per week: Not on file    Minutes per session: Not on file  . Stress: Not on file  Relationships  . Social connections:    Talks on phone: Not on file    Gets together: Not on file    Attends  religious service: Not on file    Active member of club or organization: Not on file    Attends meetings of clubs or organizations: Not on file    Relationship status: Not on file  . Intimate partner violence:    Fear of current or ex partner: Not on file    Emotionally abused: Not on file    Physically abused: Not on file    Forced sexual activity: Not on file  Other Topics Concern  . Not on file  Social History Narrative  . Not on file    Current Outpatient Medications on File Prior to Visit  Medication Sig Dispense Refill  . aspirin EC 81 MG tablet Take 81 mg by mouth every other day.     .  cetirizine (ZYRTEC) 10 MG tablet Take 10 mg by mouth daily.    Marland Kitchen. ezetimibe (ZETIA) 10 MG tablet Take 1 tablet by mouth daily.    . famotidine (PEPCID) 20 MG tablet Take 20 mg by mouth as needed for heartburn or indigestion.    . Fluticasone-Salmeterol (ADVAIR) 250-50 MCG/DOSE AEPB Inhale 1 puff into the lungs daily.     Marland Kitchen. levothyroxine (SYNTHROID, LEVOTHROID) 112 MCG tablet Take 112 mcg by mouth daily.  0  . nitroGLYCERIN (NITROSTAT) 0.4 MG SL tablet Place 1 tablet (0.4 mg total) under the tongue every 5 (five) minutes as needed for chest pain (up to 3 times). 21 tablet 0  . pravastatin (PRAVACHOL) 40 MG tablet Take 40 mg by mouth daily.    Marland Kitchen. PROAIR HFA 108 (90 Base) MCG/ACT inhaler Inhale 1-2 puffs into the lungs every 4 (four) hours as needed.  0  . amoxicillin-clavulanate (AUGMENTIN) 875-125 MG tablet Take 1 tablet by mouth 2 (two) times daily. (Patient not taking: Reported on 08/04/2018) 14 tablet 0   No current facility-administered medications on file prior to visit.     Review of Systems  Constitutional: Negative for fever.  HENT: Negative for nosebleeds.   Eyes: Negative for blurred vision.  Respiratory: Negative for cough and shortness of breath.   Cardiovascular: Negative for chest pain, palpitations and leg swelling.  Gastrointestinal: Negative for abdominal pain, nausea and vomiting.  Genitourinary: Negative for dysuria.  Musculoskeletal: Negative for myalgias.  Skin: Negative for itching and rash.  Neurological: Negative for dizziness, seizures and loss of consciousness.  Psychiatric/Behavioral: The patient is not nervous/anxious.        Objective:  Height 5\' 5"  (1.651 m), weight 167 lb (75.8 kg). Body mass index is 27.79 kg/m.  Physical Exam  Patient is alert and oriented, appeared very comfortable talking to me during the visit. No further detailed physical examination was possible as it was a telemedicine visit.  CARDIAC STUDIES:  Event Monitor- Feb-March, 2016- No  arrhythmias.  Echocardiogram. 05/11/2014 1. Left ventricle cavity is normal in size. Normal global wall motion. Doppler evidence of grade I (impaired) diastolic dysfunction. Calculated EF 57%. 2. Mild mitral regurgitation. Mild calcification of the mitral valve annulus. 3. Trace tricuspid regurgitation. 4. Insignificant pericardial effusion.  Lexiscan myoview stress test 03/29/2017: 1.The resting electrocardiogram demonstrated normal sinus rhythm, normal resting conduction and no resting arrhythmias. Stress EKG is non-diagnostic for ischemia as it a pharmacologic stress using Lexiscan. Stress symptoms included dyspnea and chest pressure. 2. The quality of the study was good. Perfusion imaging study demonstrates breast attenuation in the inferior wall, mild ischemia in the basal made and apical inferior wall could not be completely excluded however in view of the gated  SPECT images reveal normal myocardial thickening and wall motion, most probably represents a soft tissue attenuation. LVEF was normal at 59%. This is a low risk study.  Assessment & Recommendations:   1. PVC's (premature ventricular contractions)  2. Hypercholesterolemia   Laboratory Exam:  CBC Latest Ref Rng & Units 03/27/2017 07/06/2016 06/30/2016  WBC 4.0 - 10.5 K/uL 6.9 9.5 11.9(H)  Hemoglobin 12.0 - 15.0 g/dL 40.9 81.1 91.4  Hematocrit 36.0 - 46.0 % 43.4 41.7 40.7  Platelets 150 - 400 K/uL 295 340 282   CMP Latest Ref Rng & Units 03/27/2017 07/06/2016 06/30/2016  Glucose 65 - 99 mg/dL 782(N) 562(Z) 95  BUN 6 - 20 mg/dL 13 30(Q) 65(H)  Creatinine 0.44 - 1.00 mg/dL 8.46 9.62 9.52  Sodium 135 - 145 mmol/L 139 138 136  Potassium 3.5 - 5.1 mmol/L 4.0 4.1 4.0  Chloride 101 - 111 mmol/L 106 106 106  CO2 22 - 32 mmol/L 22 23 21(L)  Calcium 8.9 - 10.3 mg/dL 9.4 9.6 9.2  Total Protein 6.5 - 8.1 g/dL - - 6.4(L)  Total Bilirubin 0.3 - 1.2 mg/dL - - 0.5  Alkaline Phos 38 - 126 U/L - - 63  AST 15 - 41 U/L - - 21  ALT 14 -  54 U/L - - 20   Lipid Panel  No results found for: CHOL, TRIG, HDL, CHOLHDL, VLDL, LDLCALC, LDLDIRECT   Recommendation:  She does not have any palpitation, symptoms are well controlled with metoprolol therapy. Her blood pressure has remained normal. Patient has been checking blood pressure periodically at pharmacy. Patient was advised to continue present medications.  I have sent a prescription for metoprolol tartrate.  She will discontinue metoprolol succinate which she had from previous prescription.  Primary prevention was again discussed with her. She was advised to follow low-salt, low-cholesterol diet. Patient was advised to avoid caffeine, decongestants or any other stimulants. She was also encouraged to continue regular walking.  Return for follow-up after 1 year but call us earlier if there are any cardiac problems. She has been scheduled for CMP and lipid panel.  Patient will have echocardiogram before the next visit for follow-up of mitral regurgitation ( patient had mild mitral regurgitation on echocardiogram 4 years ago).  Earl Many, MD, [facility] 08/04/2018, 11:25 AM [facility] Pager: [phone] Office: [phone] If no answer Cell [phone]

## 2018-08-04 ENCOUNTER — Other Ambulatory Visit: Payer: Self-pay

## 2018-08-04 ENCOUNTER — Ambulatory Visit: Payer: PPO | Admitting: Cardiology

## 2018-08-04 ENCOUNTER — Encounter: Payer: Self-pay | Admitting: Cardiology

## 2018-08-04 DIAGNOSIS — E7800 Pure hypercholesterolemia, unspecified: Secondary | ICD-10-CM | POA: Diagnosis not present

## 2018-08-04 DIAGNOSIS — I493 Ventricular premature depolarization: Secondary | ICD-10-CM

## 2018-08-04 MED ORDER — METOPROLOL TARTRATE 25 MG PO TABS
25.0000 mg | ORAL_TABLET | Freq: Two times a day (BID) | ORAL | 3 refills | Status: DC
Start: 2018-08-04 — End: 2019-09-17

## 2018-10-02 IMAGING — CR DG CHEST 2V
2 series · 2 of 2 positions shown · non-contrast
Comparison: 07/06/2016

CLINICAL DATA: Chest pain.

EXAM:
CHEST  2 VIEW

[chest pa]
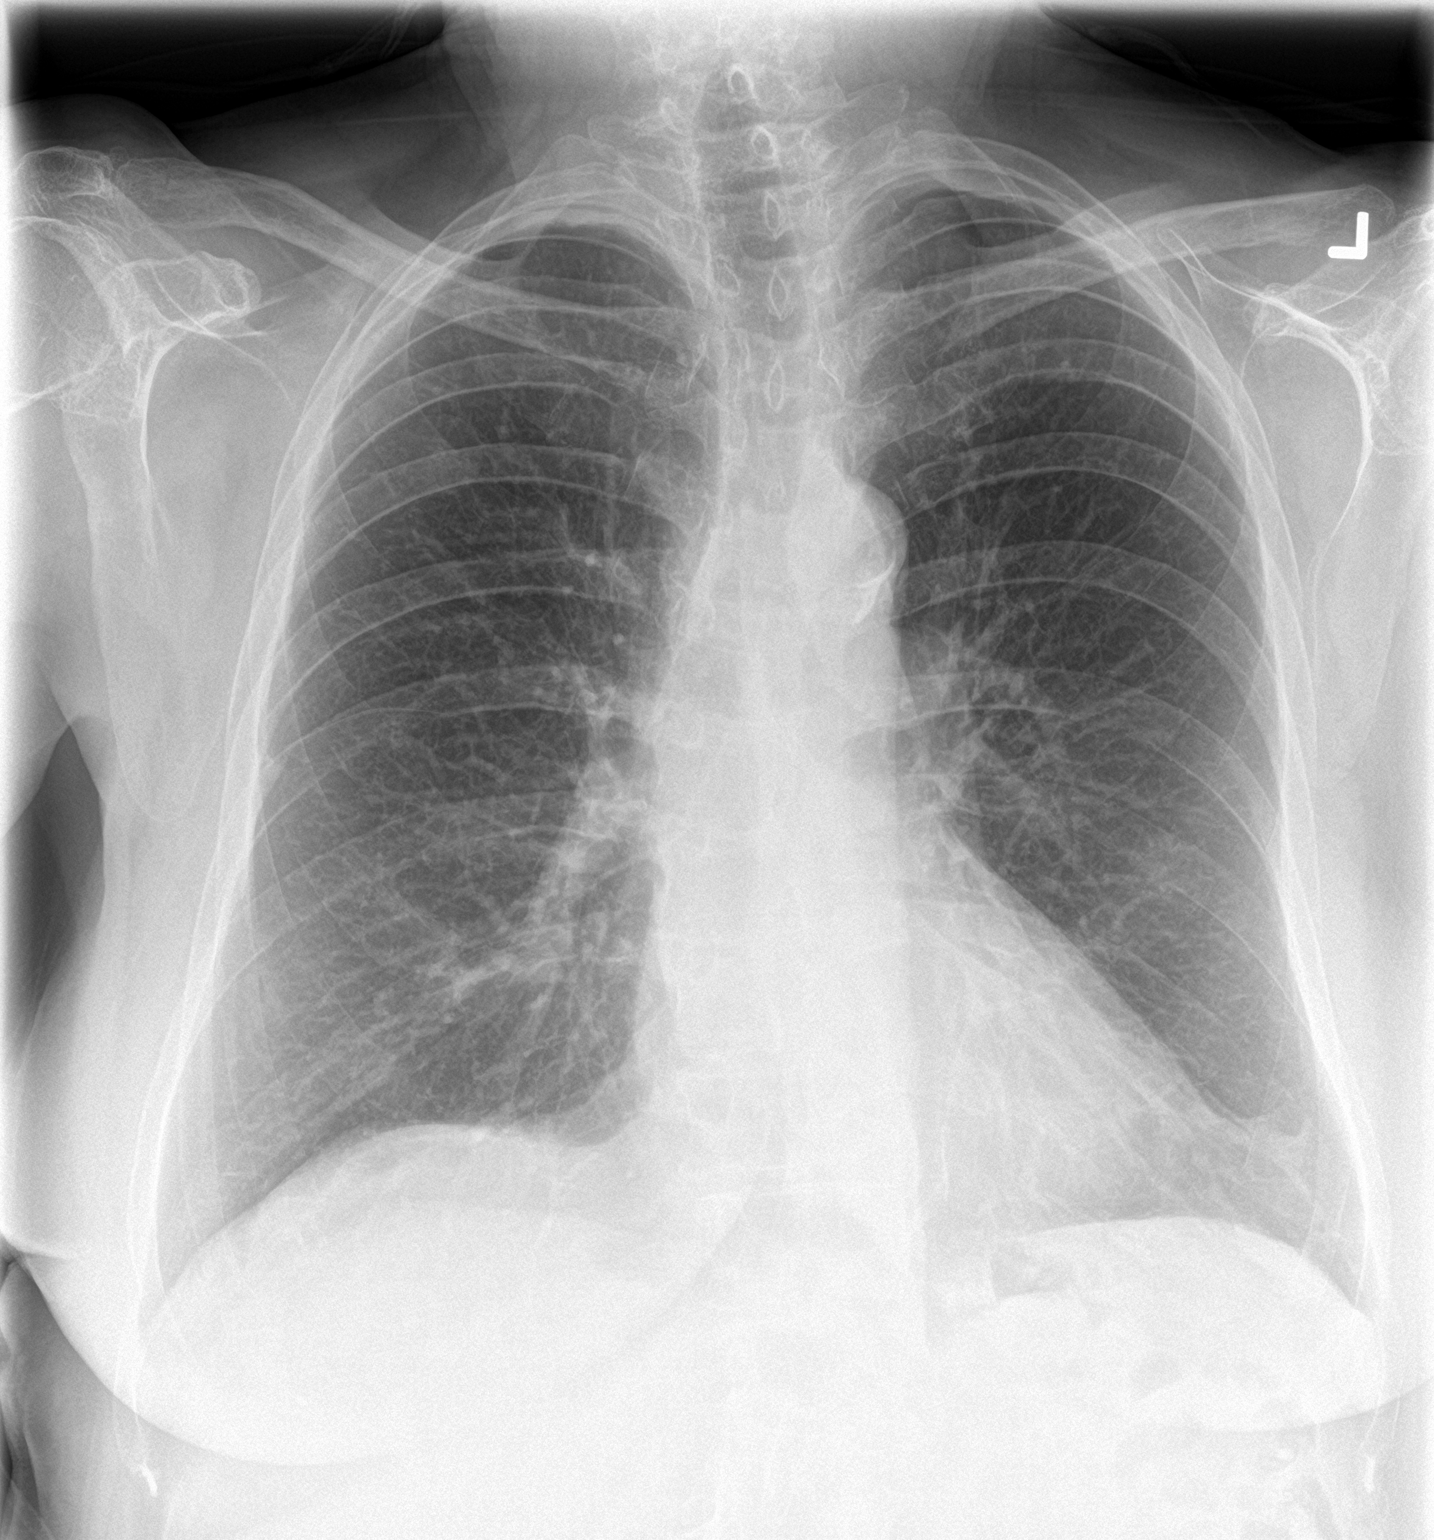

[chest lat]
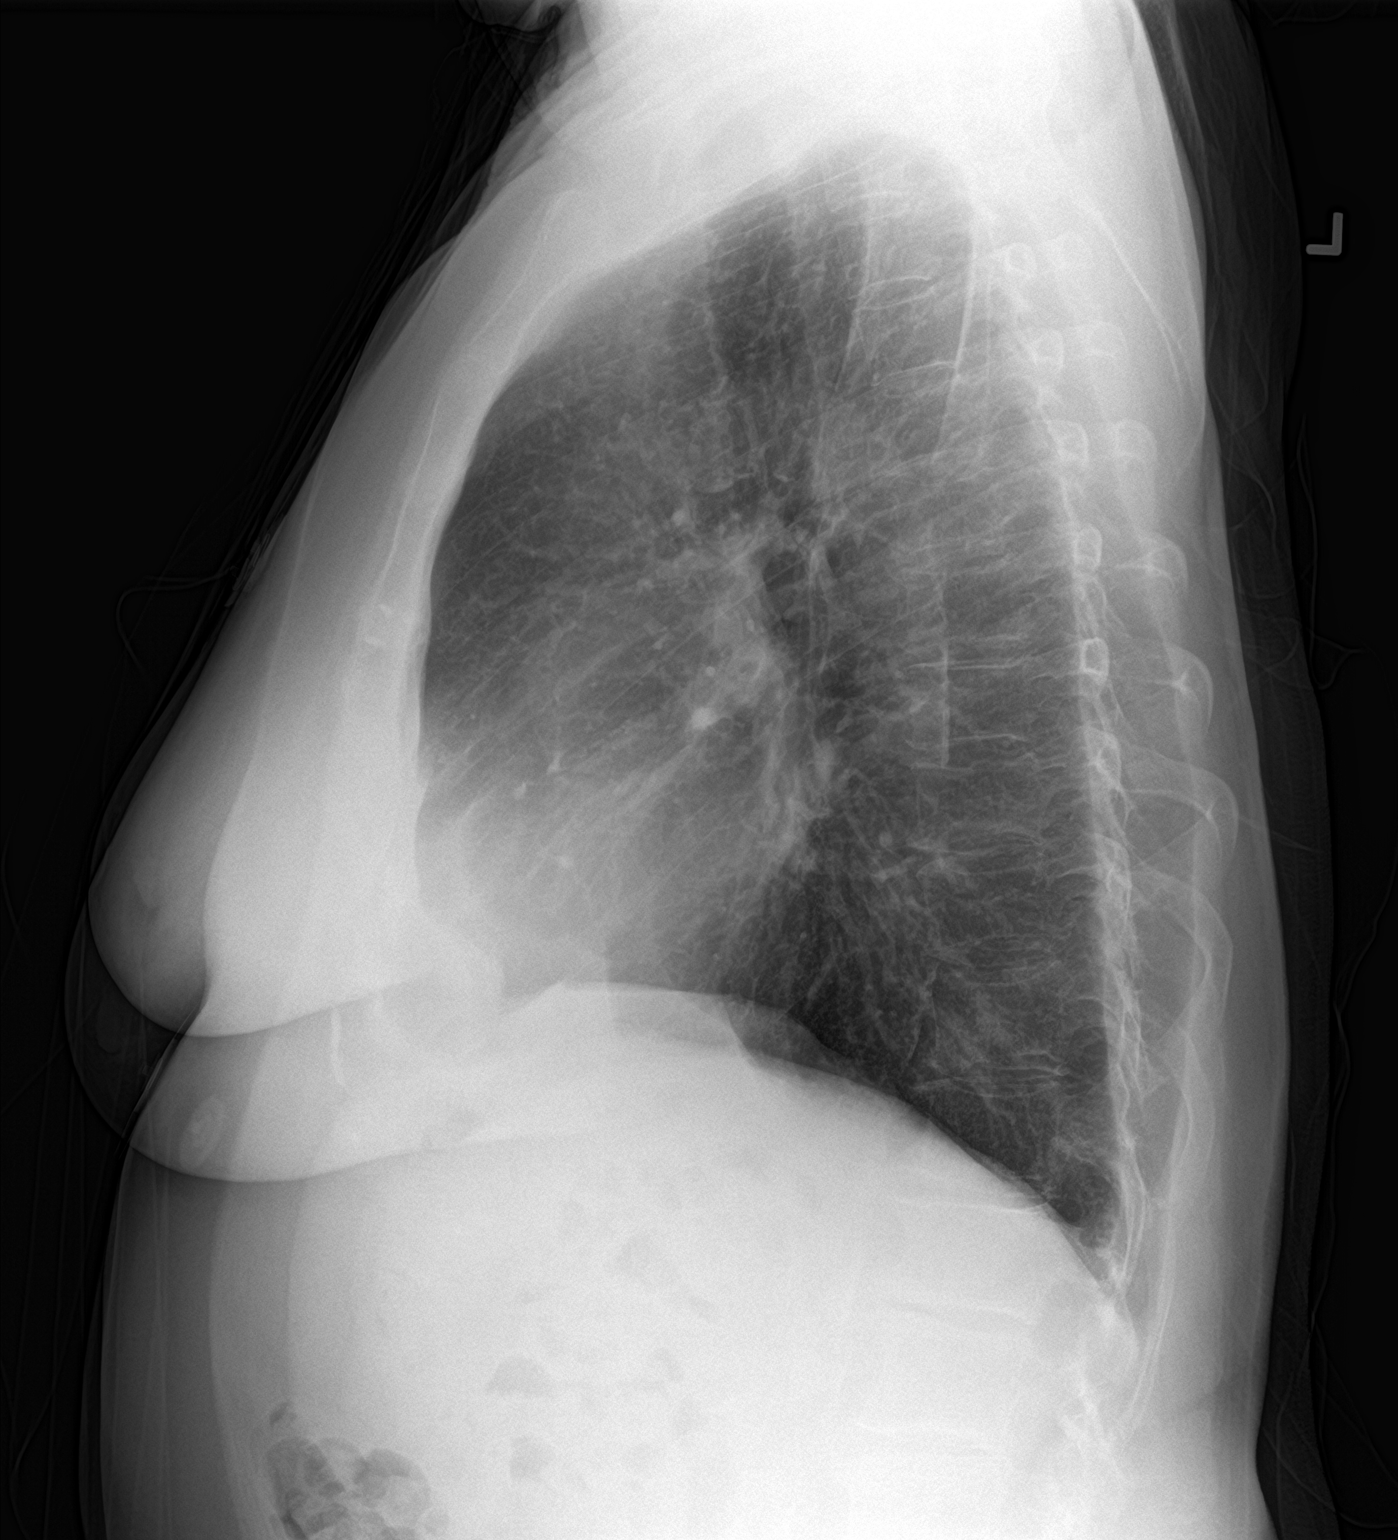

[2 of 2 positions shown; findings below may reference images not displayed]

FINDINGS: The heart size and mediastinal contours are within normal limits.
Both lungs are clear. The visualized skeletal structures are
unremarkable. Aortic atherosclerosis.
IMPRESSION: No active cardiopulmonary disease.

Aortic Atherosclerosis ([id number]).

## 2018-10-20 ENCOUNTER — Other Ambulatory Visit: Payer: Self-pay | Admitting: Cardiology

## 2018-12-22 DIAGNOSIS — J4530 Mild persistent asthma, uncomplicated: Secondary | ICD-10-CM | POA: Diagnosis not present

## 2018-12-22 DIAGNOSIS — E038 Other specified hypothyroidism: Secondary | ICD-10-CM | POA: Diagnosis not present

## 2018-12-22 DIAGNOSIS — E063 Autoimmune thyroiditis: Secondary | ICD-10-CM | POA: Diagnosis not present

## 2019-03-26 ENCOUNTER — Ambulatory Visit: Payer: PPO | Attending: Internal Medicine

## 2019-03-26 DIAGNOSIS — Z23 Encounter for immunization: Secondary | ICD-10-CM | POA: Insufficient documentation

## 2019-03-26 NOTE — Progress Notes (Signed)
   Covid-19 Vaccination Clinic  Name:  Paige Carrillo    MRN: [id number] DOB: [birth year]  03/26/2019  [name] was observed post Covid-19 immunization for 15 minutes without incidence. She was provided with Vaccine Information Sheet and instruction to access the V-Safe system.   [name] was instructed to call 911 with any severe reactions post vaccine: Marland Kitchen Difficulty breathing  . Swelling of your face and throat  . A fast heartbeat  . A bad rash all over your body  . Dizziness and weakness    Immunizations Administered    Name Date Dose VIS Date Route   Pfizer COVID-19 Vaccine 03/26/2019 12:54 PM 0.3 mL 02/13/2019 Intramuscular   Manufacturer: [employer], [employer]   Lot: [id number]   NDC: [address]-2

## 2019-04-16 ENCOUNTER — Ambulatory Visit: Payer: PPO | Attending: Internal Medicine

## 2019-04-16 DIAGNOSIS — Z23 Encounter for immunization: Secondary | ICD-10-CM | POA: Insufficient documentation

## 2019-04-16 NOTE — Progress Notes (Signed)
   Covid-19 Vaccination Clinic  Name:  Paige Carrillo    MRN: [id number] DOB: November 11, 1940  04/16/2019  [name] was observed post Covid-19 immunization for 15 minutes without incidence. She was provided with Vaccine Information Sheet and instruction to access the V-Safe system.   [name] was instructed to call 911 with any severe reactions post vaccine: Marland Kitchen Difficulty breathing  . Swelling of your face and throat  . A fast heartbeat  . A bad rash all over your body  . Dizziness and weakness    Immunizations Administered    Name Date Dose VIS Date Route   Pfizer COVID-19 Vaccine 04/16/2019  1:40 PM 0.3 mL 02/13/2019 Intramuscular   Manufacturer: [employer], [employer]   Lot: [id number]   NDC: [address]-1

## 2019-04-21 ENCOUNTER — Other Ambulatory Visit: Payer: Self-pay

## 2019-04-21 MED ORDER — PRAVASTATIN SODIUM 40 MG PO TABS: 40.0000 mg | ORAL_TABLET | Freq: Every day | ORAL | 1 refills | Status: DC

## 2019-04-21 MED ORDER — EZETIMIBE 10 MG PO TABS: 10.0000 mg | ORAL_TABLET | Freq: Every day | ORAL | 1 refills | Status: DC

## 2019-06-08 ENCOUNTER — Telehealth: Payer: Self-pay

## 2019-06-08 NOTE — Telephone Encounter (Signed)
She can increase one Metoprolol extra dose as needed for palpitations. JG

## 2019-06-08 NOTE — Telephone Encounter (Signed)
Spoke with patient and patient states that she has started having minor heart palpitations for about a week now. No significant changes in her ADL's. Patient would like to know if she needs to make a medication change. Please advise. Thanks!

## 2019-08-10 ENCOUNTER — Ambulatory Visit: Payer: PPO | Admitting: Cardiology

## 2019-08-10 ENCOUNTER — Encounter: Payer: Self-pay | Admitting: Cardiology

## 2019-08-10 ENCOUNTER — Other Ambulatory Visit: Payer: Self-pay

## 2019-08-10 VITALS — BP 130/72 | HR 66 | Resp 16 | Ht 65.0 in | Wt 171.0 lb

## 2019-08-10 DIAGNOSIS — R9431 Abnormal electrocardiogram [ECG] [EKG]: Secondary | ICD-10-CM

## 2019-08-10 DIAGNOSIS — I340 Nonrheumatic mitral (valve) insufficiency: Secondary | ICD-10-CM | POA: Diagnosis not present

## 2019-08-10 DIAGNOSIS — E7800 Pure hypercholesterolemia, unspecified: Secondary | ICD-10-CM

## 2019-08-10 DIAGNOSIS — Z87891 Personal history of nicotine dependence: Secondary | ICD-10-CM | POA: Diagnosis not present

## 2019-08-10 DIAGNOSIS — I493 Ventricular premature depolarization: Secondary | ICD-10-CM | POA: Diagnosis not present

## 2019-08-10 NOTE — Progress Notes (Signed)
Paige Carrillo Date of Birth: 1940/11/27 MRN: [id number] Primary Care Provider:Eksir, Earnest Conroy, MD Former Cardiology Providers: Dr. Vear Clock Primary Cardiologist: Rex Kras, DO, [facility] (established care 08/10/2019)  Date: 08/10/19 Last Visit: 08/04/2018   Chief Complaint  Patient presents with  . PVC  . Hyperlipidemia  . Follow-up    1 year    HPI  Paige Carrillo is a 79 y.o.  female who presents to the office with a chief complaint of " 1 year follow-up." Patient's past medical history and cardiovascular risk factors include: PVCs, HLD, hypothyroidism , postmeanpasual female, advance age.   Patient was previously under the care of Dr. Vear Clock and was last seen in the office back in June 2020.  Patient is here for her yearly follow-up and also to reestablish care with a new provider and I am seeing her for the first time for the above-mentioned chief complaint.  Since last office visit patient states that she has not been hospitalized or seen in urgent care for cardiovascular symptoms.  Clinically patient states that she is stable as compared to last year.  She continues to walk her dog 4 to 6 miles per day and does not have effort related chest pain shortness of breath at rest or with effort related activities.   Patient was recommended to undergo an echocardiogram at the last office visit to reevaluate the severity of mitral regurgitation. But patient forgot the fact it had to be done before today's office visit.   Denies prior history of coronary artery disease, myocardial infarction, congestive heart failure, deep venous thrombosis, pulmonary embolism, stroke, transient ischemic attack.  FUNCTIONAL STATUS: She walks 4-79mle per day.    ALLERGIES: Allergies  Allergen Reactions  . Darvon [Propoxyphene] Other (See Comments)    "Felt weird"      MEDICATION LIST PRIOR TO VISIT: Current Outpatient Medications on File Prior to Visit  Medication Sig  Dispense Refill  . aspirin EC 81 MG tablet Take 81 mg by mouth every other day.     . cetirizine (ZYRTEC) 10 MG tablet Take 10 mg by mouth daily.    .Marland Kitchenezetimibe (ZETIA) 10 MG tablet Take 1 tablet (10 mg total) by mouth daily. 90 tablet 1  . famotidine (PEPCID) 20 MG tablet Take 20 mg by mouth as needed for heartburn or indigestion.    . Fluticasone-Salmeterol (ADVAIR) 250-50 MCG/DOSE AEPB Inhale 1 puff into the lungs daily.     .Marland Kitchenlevothyroxine (SYNTHROID, LEVOTHROID) 112 MCG tablet Take 112 mcg by mouth daily.  0  . metoprolol tartrate (LOPRESSOR) 25 MG tablet Take 1 tablet (25 mg total) by mouth 2 (two) times daily. 180 tablet 3  . nitroGLYCERIN (NITROSTAT) 0.4 MG SL tablet Place 1 tablet (0.4 mg total) under the tongue every 5 (five) minutes as needed for chest pain (up to 3 times). 21 tablet 0  . pravastatin (PRAVACHOL) 40 MG tablet Take 1 tablet (40 mg total) by mouth daily. 90 tablet 1  . PROAIR HFA 108 (90 Base) MCG/ACT inhaler Inhale 1-2 puffs into the lungs every 4 (four) hours as needed.  0   No current facility-administered medications on file prior to visit.    PAST MEDICAL HISTORY: Past Medical History:  Diagnosis Date  . Irregular heart rhythm     PAST SURGICAL HISTORY: History reviewed. No pertinent surgical history.  FAMILY HISTORY: The patient's family history includes Heart attack in her brother and father; Heart disease in her father.   SOCIAL  HISTORY:  The patient  reports that she has quit smoking. She has never used smokeless tobacco. She reports that she does not drink alcohol or use drugs.  Review of Systems  Constitution: Negative for chills and fever.  HENT: Negative for hoarse voice and nosebleeds.   Eyes: Negative for discharge, double vision and pain.  Cardiovascular: Negative for chest pain, claudication, dyspnea on exertion, leg swelling, near-syncope, orthopnea, palpitations, paroxysmal nocturnal dyspnea and syncope.  Respiratory: Negative for  hemoptysis and shortness of breath.   Musculoskeletal: Negative for muscle cramps and myalgias.  Gastrointestinal: Negative for abdominal pain, constipation, diarrhea, hematemesis, hematochezia, melena, nausea and vomiting.  Neurological: Negative for dizziness and light-headedness.   PHYSICAL EXAM: Vitals with BMI 08/10/2019 08/04/2018 03/27/2017  Height _0  _1  -  Weight 171 lbs 167 lbs -  BMI 40.81 44.81 -  Systolic 856 - 314  Diastolic 72 - 81  Pulse 66 - 82    CONSTITUTIONAL: Well-developed and well-nourished. No acute distress.  SKIN: Skin is warm and dry. No rash noted. No cyanosis. No pallor. No jaundice HEAD: Normocephalic and atraumatic.  EYES: No scleral icterus MOUTH/THROAT: Moist oral membranes.  NECK: No JVD present. No thyromegaly noted. No carotid bruits  LYMPHATIC: No visible cervical adenopathy.  CHEST Normal respiratory effort. No intercostal retractions  LUNGS: Clear to auscultation bilaterally.  No stridor. No wheezes. No rales.  CARDIOVASCULAR: Regular rate and rhythm, positive S1-S2, no murmurs rubs or gallops appreciated ABDOMINAL: No apparent ascites.  EXTREMITIES: No peripheral edema  HEMATOLOGIC: No significant bruising NEUROLOGIC: Oriented to person, place, and time. Nonfocal. Normal muscle tone.  PSYCHIATRIC: Normal mood and affect. Normal behavior. Cooperative  CARDIAC DATABASE: EKG: 08/10/2019: Sinus bradycardia, 56 bpm, normal axis, poor R wave progression, T wave inversions in the anterior septal leads suggestive of possible ischemia, without underlying injury pattern.  Compared to prior EKGs T wave inversions in lead V2 and V3 are new.  Echocardiogram: 05/11/2014: LVEF 97%, grade 1 diastolic impairment, mild MR, mild MAC, trace TR.    Stress Testing:  Lexiscan myoview stress test 03/29/2017: Perfusion imaging study demonstrates breast attenuation in the inferior wall, mild ischemia in the basal made and apical inferior wall could not be completely  excluded however in view of the gated SPECT images reveal normal myocardial thickening and wall motion, most probably represents a soft tissue attenuation. LVEF was normal at 59%. This is a low risk study.  LABORATORY DATA: CBC Latest Ref Rng & Units 03/27/2017 07/06/2016 06/30/2016  WBC 4.0 - 10.5 K/uL 6.9 9.5 11.9(H)  Hemoglobin 12.0 - 15.0 g/dL 14.4 13.8 13.5  Hematocrit 36.0 - 46.0 % 43.4 41.7 40.7  Platelets 150 - 400 K/uL 295 340 282    CMP Latest Ref Rng & Units 03/27/2017 07/06/2016 06/30/2016  Glucose 65 - 99 mg/dL 122(H) 107(H) 95  BUN 6 - 20 mg/dL 13 24(H) 21(H)  Creatinine 0.44 - 1.00 mg/dL 0.90 0.75 0.81  Sodium 135 - 145 mmol/L 139 138 136  Potassium 3.5 - 5.1 mmol/L 4.0 4.1 4.0  Chloride 101 - 111 mmol/L 106 106 106  CO2 22 - 32 mmol/L 22 23 21(L)  Calcium 8.9 - 10.3 mg/dL 9.4 9.6 9.2  Total Protein 6.5 - 8.1 g/dL - - 6.4(L)  Total Bilirubin 0.3 - 1.2 mg/dL - - 0.5  Alkaline Phos 38 - 126 U/L - - 63  AST 15 - 41 U/L - - 21  ALT 14 - 54 U/L - - 20  Lipid Panel  No results found for: CHOL, TRIG, HDL, CHOLHDL, VLDL, LDLCALC, LDLDIRECT, LABVLDL  No results found for: HGBA1C No components found for: NTPROBNP No results found for: TSH  Cardiac Panel (last 3 results) No results for input(s): CKTOTAL, CKMB, TROPONINIHS, RELINDX in the last 72 hours.  IMPRESSION:    ICD-10-CM   1. PVC's (premature ventricular contractions)  I49.3 EKG 12-Lead  2. Hypercholesterolemia  E78.00   3. Former smoker  Z87.891   4. Nonrheumatic mitral valve regurgitation  I34.0 PCV ECHOCARDIOGRAM COMPLETE  5. Abnormal EKG  R94.31 PCV MYOCARDIAL PERFUSION WITH LEXISCAN     RECOMMENDATIONS: Paige Carrillo is a 79 y.o. female whose past medical history and cardiovascular risk factors include: History of PVCs, hyperlipidemia, nonrheumatic mild mitral regurgitation, former smoker, postmenopausal female, advanced age.  PVCs: Patient remains asymptomatic and tolerating Lopressor well  without any side effects or intolerances.  Hyperlipidemia: Continue statin therapy and Zetia.  Does not endorse myalgias.  Patient had an EKG since the last office visit and it shows a sinus bradycardia with T wave inversions in the anteroseptal leads suggestive of possible ischemia.  Clinically patient does not have any chest pain or shortness of breath at rest or with effort related activities.  And she continues to ambulate 4 to 6 miles on a regular basis.  Suspicion for underlying obstructive CAD is minimal however given the EKG changes, risk factors, and prior MPI findings recommend Lexiscan.  Patient is agreeable.  Plan echocardiogram to reevaluate mitral regurgitation prior to the next office visit.  I plan to see Paige Carrillo in 1 year or sooner either if her stress test is reported to be abnormal or she develops new cardiovascular symptoms.  The plan of care discussed with the patient in great detail.  She finds the plan of care acceptable and is thankful for being thorough.  FINAL MEDICATION LIST END OF ENCOUNTER: No orders of the defined types were placed in this encounter.   Medications Discontinued During This Encounter  Medication Reason  . amoxicillin-clavulanate (AUGMENTIN) 875-125 MG tablet Completed Course  . metoprolol succinate (TOPROL-XL) 25 MG 24 hr tablet Change in therapy     Current Outpatient Medications:  .  aspirin EC 81 MG tablet, Take 81 mg by mouth every other day. , Disp: , Rfl:  .  cetirizine (ZYRTEC) 10 MG tablet, Take 10 mg by mouth daily., Disp: , Rfl:  .  ezetimibe (ZETIA) 10 MG tablet, Take 1 tablet (10 mg total) by mouth daily., Disp: 90 tablet, Rfl: 1 .  famotidine (PEPCID) 20 MG tablet, Take 20 mg by mouth as needed for heartburn or indigestion., Disp: , Rfl:  .  Fluticasone-Salmeterol (ADVAIR) 250-50 MCG/DOSE AEPB, Inhale 1 puff into the lungs daily. , Disp: , Rfl:  .  levothyroxine (SYNTHROID, LEVOTHROID) 112 MCG tablet, Take 112 mcg by mouth  daily., Disp: , Rfl: 0 .  metoprolol tartrate (LOPRESSOR) 25 MG tablet, Take 1 tablet (25 mg total) by mouth 2 (two) times daily., Disp: 180 tablet, Rfl: 3 .  nitroGLYCERIN (NITROSTAT) 0.4 MG SL tablet, Place 1 tablet (0.4 mg total) under the tongue every 5 (five) minutes as needed for chest pain (up to 3 times)., Disp: 21 tablet, Rfl: 0 .  pravastatin (PRAVACHOL) 40 MG tablet, Take 1 tablet (40 mg total) by mouth daily., Disp: 90 tablet, Rfl: 1 .  PROAIR HFA 108 (90 Base) MCG/ACT inhaler, Inhale 1-2 puffs into the lungs every 4 (four) hours as needed., Disp: ,  Rfl: 0  Orders Placed This Encounter  Procedures  . PCV MYOCARDIAL PERFUSION WITH LEXISCAN  . EKG 12-Lead  . PCV ECHOCARDIOGRAM COMPLETE   --Continue cardiac medications as reconciled in final medication list. --Return in about 1 year (around 08/09/2020) for re-evaluation of symptoms., review test results.. Or sooner if needed. --Continue follow-up with your primary care physician regarding the management of your other chronic comorbid conditions.  Patient's questions and concerns were addressed to her satisfaction. She voices understanding of the instructions provided during this encounter.   This note was created using a voice recognition software as a result there may be grammatical errors inadvertently enclosed that do not reflect the nature of this encounter. Every attempt is made to correct such errors.  Rex Kras, [place], [facility]  Pager: [phone] Office: [phone]

## 2019-08-31 ENCOUNTER — Ambulatory Visit: Payer: PPO

## 2019-08-31 ENCOUNTER — Other Ambulatory Visit: Payer: Self-pay

## 2019-08-31 DIAGNOSIS — R9431 Abnormal electrocardiogram [ECG] [EKG]: Secondary | ICD-10-CM | POA: Diagnosis not present

## 2019-09-11 ENCOUNTER — Telehealth: Payer: Self-pay

## 2019-09-11 NOTE — Telephone Encounter (Signed)
-----   Message from [place], [place] sent at 09/07/2019  1:08 PM EDT ----- Please inform the patient that her stress test was reported to be abnormal.  I would like to see her at the next available appointment to review the findings of the stress test and her symptoms.In the interim if she has chest pain or shortness of breath that increase in intensity, frequency, duration or has typical chest pain she needs to go to the nearest ER via EMS for further evaluation.

## 2019-09-14 ENCOUNTER — Encounter: Payer: Self-pay | Admitting: Cardiology

## 2019-09-14 ENCOUNTER — Other Ambulatory Visit: Payer: Self-pay

## 2019-09-14 ENCOUNTER — Ambulatory Visit: Payer: PPO | Admitting: Cardiology

## 2019-09-14 VITALS — BP 141/67 | HR 63 | Resp 16 | Ht 65.0 in | Wt 171.2 lb

## 2019-09-14 DIAGNOSIS — E7800 Pure hypercholesterolemia, unspecified: Secondary | ICD-10-CM

## 2019-09-14 DIAGNOSIS — I493 Ventricular premature depolarization: Secondary | ICD-10-CM | POA: Diagnosis not present

## 2019-09-14 DIAGNOSIS — I340 Nonrheumatic mitral (valve) insufficiency: Secondary | ICD-10-CM

## 2019-09-14 DIAGNOSIS — Z87891 Personal history of nicotine dependence: Secondary | ICD-10-CM | POA: Diagnosis not present

## 2019-09-14 DIAGNOSIS — R931 Abnormal findings on diagnostic imaging of heart and coronary circulation: Secondary | ICD-10-CM

## 2019-09-14 NOTE — Progress Notes (Signed)
Paige Carrillo Date of Birth: 06-09-1940 MRN: [id number] Primary Care Provider:Eksir, Earnest Conroy, MD Former Cardiology Providers: Dr. Vear Clock Primary Cardiologist: Rex Kras, DO, [facility] (established care 08/10/2019)  Date: 09/14/19 Last Visit: 08/10/2019  Chief Complaint  Patient presents with  . Results    HPI  Paige Carrillo is a 80 y.o.  female who presents to the office with a chief complaint of " review test results." Patient's past medical history and cardiovascular risk factors include: PVCs, HLD, hypothyroidism , postmeanpasual female, advance age.   Patient was previously under the care of Dr. Vear Clock which was transitioned to me back in June 2021.  During the last office visit patient had an EKG which showed normal sinus bradycardia with T wave inversion in the anteroseptal leads suggestive of possible ischemia.  Her overall functional capacity is excellent for her age but given the EKG changes and prior MPI findings nuclear stress test was recommended.    Clinically, patient's overall functional status remains the same and does not have any chest pain or shortness of breath at rest or with effort related activities.  Her nuclear stress test was reported to show normal myocardial perfusion per report.  Was noted to have post stress dilatation of the LV with a TID index of 1.25.  Per gated SPECT her LVEF was reported to be 33% with global hypokinesis and overall high risk stress test.  These findings were discussed with the patient in great detail at today's office visit.  Denies prior history of coronary artery disease, myocardial infarction, congestive heart failure, deep venous thrombosis, pulmonary embolism, stroke, transient ischemic attack.  FUNCTIONAL STATUS: She walks 4-62mle per day.    ALLERGIES: Allergies  Allergen Reactions  . Darvon [Propoxyphene] Other (See Comments)    "Felt weird"      MEDICATION LIST PRIOR TO VISIT: Current  Outpatient Medications on File Prior to Visit  Medication Sig Dispense Refill  . aspirin EC 81 MG tablet Take 81 mg by mouth every other day.     . cetirizine (ZYRTEC) 10 MG tablet Take 10 mg by mouth daily.    .Marland Kitchenezetimibe (ZETIA) 10 MG tablet Take 1 tablet (10 mg total) by mouth daily. 90 tablet 1  . famotidine (PEPCID) 20 MG tablet Take 20 mg by mouth as needed for heartburn or indigestion.    . Fluticasone-Salmeterol (ADVAIR) 250-50 MCG/DOSE AEPB Inhale 1 puff into the lungs daily.     .Marland Kitchenlevothyroxine (SYNTHROID, LEVOTHROID) 112 MCG tablet Take 112 mcg by mouth daily.  0  . metoprolol tartrate (LOPRESSOR) 25 MG tablet Take 1 tablet (25 mg total) by mouth 2 (two) times daily. 180 tablet 3  . nitroGLYCERIN (NITROSTAT) 0.4 MG SL tablet Place 1 tablet (0.4 mg total) under the tongue every 5 (five) minutes as needed for chest pain (up to 3 times). 21 tablet 0  . pravastatin (PRAVACHOL) 40 MG tablet Take 1 tablet (40 mg total) by mouth daily. 90 tablet 1  . PROAIR HFA 108 (90 Base) MCG/ACT inhaler Inhale 1-2 puffs into the lungs every 4 (four) hours as needed.  0   No current facility-administered medications on file prior to visit.    PAST MEDICAL HISTORY: Past Medical History:  Diagnosis Date  . Irregular heart rhythm     PAST SURGICAL HISTORY: History reviewed. No pertinent surgical history.  FAMILY HISTORY: The patient's family history includes Heart attack in her brother and father; Heart disease in her father.   SOCIAL  HISTORY:  The patient  reports that she has quit smoking. She has never used smokeless tobacco. She reports that she does not drink alcohol and does not use drugs.  Review of Systems  Constitutional: Negative for chills and fever.  HENT: Negative for hoarse voice and nosebleeds.   Eyes: Negative for discharge, double vision and pain.  Cardiovascular: Negative for chest pain, claudication, dyspnea on exertion, leg swelling, near-syncope, orthopnea, palpitations,  paroxysmal nocturnal dyspnea and syncope.  Respiratory: Negative for hemoptysis and shortness of breath.   Musculoskeletal: Negative for muscle cramps and myalgias.  Gastrointestinal: Negative for abdominal pain, constipation, diarrhea, hematemesis, hematochezia, melena, nausea and vomiting.  Neurological: Negative for dizziness and light-headedness.   PHYSICAL EXAM: Vitals with BMI 09/14/2019 08/10/2019 08/04/2018  Height _0  _1  _2   Weight 171 lbs 3 oz 171 lbs 167 lbs  BMI 28.49 25.85 27.78  Systolic 242 353 -  Diastolic 67 72 -  Pulse 63 66 -    CONSTITUTIONAL: Well-developed and well-nourished. No acute distress.  SKIN: Skin is warm and dry. No rash noted. No cyanosis. No pallor. No jaundice HEAD: Normocephalic and atraumatic.  EYES: No scleral icterus MOUTH/THROAT: Moist oral membranes.  NECK: No JVD present. No thyromegaly noted. No carotid bruits  LYMPHATIC: No visible cervical adenopathy.  CHEST Normal respiratory effort. No intercostal retractions  LUNGS: Clear to auscultation bilaterally.  No stridor. No wheezes. No rales.  CARDIOVASCULAR: Regular rate and rhythm, positive S1-S2, no murmurs rubs or gallops appreciated ABDOMINAL: No apparent ascites.  EXTREMITIES: No peripheral edema  HEMATOLOGIC: No significant bruising NEUROLOGIC: Oriented to person, place, and time. Nonfocal. Normal muscle tone.  PSYCHIATRIC: Normal mood and affect. Normal behavior. Cooperative  CARDIAC DATABASE: EKG: 08/10/2019: Sinus bradycardia, 56 bpm, normal axis, poor R wave progression, T wave inversions in the anterior septal leads suggestive of possible ischemia, without underlying injury pattern.  Compared to prior EKGs T wave inversions in lead V2 and V3 are new.  Echocardiogram: 05/11/2014: LVEF 61%, grade 1 diastolic impairment, mild MR, mild MAC, trace TR.    Stress Testing:  Lexiscan myoview stress test 03/29/2017: Perfusion imaging study demonstrates breast attenuation in the  inferior wall, mild ischemia in the basal made and apical inferior wall could not be completely excluded however in view of the gated SPECT images reveal normal myocardial thickening and wall motion, most probably represents a soft tissue attenuation. LVEF was normal at 59%. This is a low risk study.  Lexiscan (Walking with mod Bruce)Tetrofosmin Stress Test  08/31/2019: Non-diagnostic ECG stress. Myocardial perfusion is normal. There is abnormal post stress dilatation of the LV with TID index of 1.25. Overall LV systolic function is abnormal without regional wall motion abnormalities. Global hypokinesis of the left ventricle. Stress LV EF: 33%.  High risk study. Compared to the study done on 03/29/2017, previously diaphragmatic attenuation and normal LVEF.   LABORATORY DATA: CBC Latest Ref Rng & Units 03/27/2017 07/06/2016 06/30/2016  WBC 4.0 - 10.5 K/uL 6.9 9.5 11.9(H)  Hemoglobin 12.0 - 15.0 g/dL 14.4 13.8 13.5  Hematocrit 36 - 46 % 43.4 41.7 40.7  Platelets 150 - 400 K/uL 295 340 282    CMP Latest Ref Rng & Units 03/27/2017 07/06/2016 06/30/2016  Glucose 65 - 99 mg/dL 122(H) 107(H) 95  BUN 6 - 20 mg/dL 13 24(H) 21(H)  Creatinine 0.44 - 1.00 mg/dL 0.90 0.75 0.81  Sodium 135 - 145 mmol/L 139 138 136  Potassium 3.5 - 5.1 mmol/L 4.0 4.1 4.0  Chloride  101 - 111 mmol/L 106 106 106  CO2 22 - 32 mmol/L 22 23 21(L)  Calcium 8.9 - 10.3 mg/dL 9.4 9.6 9.2  Total Protein 6.5 - 8.1 g/dL - - 6.4(L)  Total Bilirubin 0.3 - 1.2 mg/dL - - 0.5  Alkaline Phos 38 - 126 U/L - - 63  AST 15 - 41 U/L - - 21  ALT 14 - 54 U/L - - 20    Lipid Panel  No results found for: CHOL, TRIG, HDL, CHOLHDL, VLDL, LDLCALC, LDLDIRECT, LABVLDL  No results found for: HGBA1C No components found for: NTPROBNP No results found for: TSH  Cardiac Panel (last 3 results) No results for input(s): CKTOTAL, CKMB, TROPONINIHS, RELINDX in the last 72 hours.  IMPRESSION:    ICD-10-CM   1. Abnormal nuclear cardiac imaging test   R93.1 CT CORONARY MORPH W/CTA COR W/SCORE W/CA W/CM &/OR WO/CM    CT CORONARY FRACTIONAL FLOW RESERVE DATA PREP    CT CORONARY FRACTIONAL FLOW RESERVE FLUID ANALYSIS  2. PVC's (premature ventricular contractions)  I49.3   3. Hypercholesterolemia  E78.00   4. Former smoker  Z87.891   5. Nonrheumatic mitral valve regurgitation  I34.0      RECOMMENDATIONS: Paige Carrillo is a 79 y.o. female whose past medical history and cardiovascular risk factors include: History of PVCs, hyperlipidemia, nonrheumatic mild mitral regurgitation, former smoker, postmenopausal female, advanced age.  Abnormal nuclear stress test:  Nuclear stress test images were independently reviewed with the patient at today's office visit.  Compared to prior myocardial perfusion imaging study there is significant decrease in her LVEF per gated SPECT and most recent study notes transient ischemic dilatation per report.  Despite the fact the patient has good functional possibly for age recommended coronary CTA to rule out obstructive CAD.  Patient is in agreement after discussing the risks, benefits, and alternatives.  Patient is already on beta-blocker therapy.  We will obtain the echo prior to the next office visit.  PVCs: Patient remains asymptomatic and tolerating Lopressor well without any side effects or intolerances.  Hyperlipidemia: Continue statin therapy and Zetia.  Does not endorse myalgias.  Former smoker: Educated on the importance of continued smoking cessation.  FINAL MEDICATION LIST END OF ENCOUNTER: No orders of the defined types were placed in this encounter.   There are no discontinued medications.   Current Outpatient Medications:  .  aspirin EC 81 MG tablet, Take 81 mg by mouth every other day. , Disp: , Rfl:  .  cetirizine (ZYRTEC) 10 MG tablet, Take 10 mg by mouth daily., Disp: , Rfl:  .  ezetimibe (ZETIA) 10 MG tablet, Take 1 tablet (10 mg total) by mouth daily., Disp: 90 tablet,  Rfl: 1 .  famotidine (PEPCID) 20 MG tablet, Take 20 mg by mouth as needed for heartburn or indigestion., Disp: , Rfl:  .  Fluticasone-Salmeterol (ADVAIR) 250-50 MCG/DOSE AEPB, Inhale 1 puff into the lungs daily. , Disp: , Rfl:  .  levothyroxine (SYNTHROID, LEVOTHROID) 112 MCG tablet, Take 112 mcg by mouth daily., Disp: , Rfl: 0 .  metoprolol tartrate (LOPRESSOR) 25 MG tablet, Take 1 tablet (25 mg total) by mouth 2 (two) times daily., Disp: 180 tablet, Rfl: 3 .  nitroGLYCERIN (NITROSTAT) 0.4 MG SL tablet, Place 1 tablet (0.4 mg total) under the tongue every 5 (five) minutes as needed for chest pain (up to 3 times)., Disp: 21 tablet, Rfl: 0 .  pravastatin (PRAVACHOL) 40 MG tablet, Take 1 tablet (40 mg total) by  mouth daily., Disp: 90 tablet, Rfl: 1 .  PROAIR HFA 108 (90 Base) MCG/ACT inhaler, Inhale 1-2 puffs into the lungs every 4 (four) hours as needed., Disp: , Rfl: 0  Orders Placed This Encounter  Procedures  . CT CORONARY MORPH W/CTA COR W/SCORE W/CA W/CM &/OR WO/CM  . CT CORONARY FRACTIONAL FLOW RESERVE DATA PREP  . CT CORONARY FRACTIONAL FLOW RESERVE FLUID ANALYSIS   --Continue cardiac medications as reconciled in final medication list. --Return in about 4 weeks (around 10/12/2019). Or sooner if needed. --Continue follow-up with your primary care physician regarding the management of your other chronic comorbid conditions.  Patient's questions and concerns were addressed to her satisfaction. She voices understanding of the instructions provided during this encounter.   This note was created using a voice recognition software as a result there may be grammatical errors inadvertently enclosed that do not reflect the nature of this encounter. Every attempt is made to correct such errors.  Rex Kras, [place], [facility]  Pager: [phone] Office: [phone]

## 2019-09-14 NOTE — Patient Instructions (Signed)
On the day of the CT: Take Lopressor two hours before the test.  Take the medication bottle (lopressor) with you.

## 2019-09-15 ENCOUNTER — Ambulatory Visit: Payer: PPO | Admitting: Cardiology

## 2019-09-16 ENCOUNTER — Other Ambulatory Visit: Payer: Self-pay | Admitting: Cardiology

## 2019-09-16 DIAGNOSIS — I493 Ventricular premature depolarization: Secondary | ICD-10-CM

## 2019-09-17 ENCOUNTER — Other Ambulatory Visit: Payer: Self-pay

## 2019-09-17 ENCOUNTER — Ambulatory Visit: Payer: PPO

## 2019-09-17 ENCOUNTER — Telehealth: Payer: Self-pay

## 2019-09-17 DIAGNOSIS — I340 Nonrheumatic mitral (valve) insufficiency: Secondary | ICD-10-CM | POA: Diagnosis not present

## 2019-09-17 NOTE — Telephone Encounter (Signed)
error 

## 2019-09-24 NOTE — Progress Notes (Signed)
Called patient, NA, no VM to leave message.

## 2019-09-25 ENCOUNTER — Telehealth: Payer: Self-pay

## 2019-09-25 NOTE — Telephone Encounter (Signed)
-----   Message from [place], [place] sent at 09/24/2019  2:13 PM EDT ----- Called patient, NA, no VM to leave message.

## 2019-09-29 ENCOUNTER — Other Ambulatory Visit: Payer: Self-pay | Admitting: Cardiology

## 2019-09-29 DIAGNOSIS — R931 Abnormal findings on diagnostic imaging of heart and coronary circulation: Secondary | ICD-10-CM

## 2019-09-30 ENCOUNTER — Telehealth: Payer: Self-pay

## 2019-09-30 LAB — BASIC METABOLIC PANEL
BUN/Creatinine Ratio: 10 — ABNORMAL LOW (ref 12–28)
BUN: 10 mg/dL (ref 8–27)
CO2: 23 mmol/L (ref 20–29)
Calcium: 9.6 mg/dL (ref 8.7–10.3)
Chloride: 102 mmol/L (ref 96–106)
Creatinine, Ser: 0.96 mg/dL (ref 0.57–1.00)
GFR calc Af Amer: 66 mL/min/{1.73_m2} (ref >59)
GFR calc non Af Amer: 57 mL/min/{1.73_m2} — ABNORMAL LOW (ref >59)
Glucose: 93 mg/dL (ref 65–99)
Potassium: 4.2 mmol/L (ref 3.5–5.2)
Sodium: 138 mmol/L (ref 134–144)

## 2019-09-30 NOTE — Telephone Encounter (Signed)
Patient has been contacted several times. VM not set up.

## 2019-10-06 ENCOUNTER — Telehealth: Payer: Self-pay

## 2019-10-06 NOTE — Telephone Encounter (Signed)
-----   Message from [place], [place] sent at 10/01/2019  3:32 PM EDT ----- Results reviewed.Serum potassium levels are within normal limits.Kidney function is relatively at baseline.Continue current medical therapy.Call if questions arise.

## 2019-10-06 NOTE — Telephone Encounter (Signed)
Unable to reach pt or leave a voicemail  

## 2019-10-07 NOTE — Telephone Encounter (Signed)
Number went straight to vm and was unable to leave message.

## 2019-10-12 ENCOUNTER — Ambulatory Visit: Payer: PPO | Admitting: Cardiology

## 2019-10-14 ENCOUNTER — Other Ambulatory Visit (HOSPITAL_COMMUNITY): Payer: Self-pay

## 2019-10-16 ENCOUNTER — Other Ambulatory Visit: Payer: Self-pay | Admitting: Cardiology

## 2019-10-20 ENCOUNTER — Ambulatory Visit: Payer: PPO | Admitting: Cardiology

## 2019-10-20 ENCOUNTER — Other Ambulatory Visit: Payer: Self-pay

## 2019-10-20 ENCOUNTER — Encounter: Payer: Self-pay | Admitting: Cardiology

## 2019-10-20 VITALS — BP 125/63 | HR 69 | Resp 15 | Ht 65.0 in | Wt 170.0 lb

## 2019-10-20 DIAGNOSIS — Z87891 Personal history of nicotine dependence: Secondary | ICD-10-CM | POA: Diagnosis not present

## 2019-10-20 DIAGNOSIS — I493 Ventricular premature depolarization: Secondary | ICD-10-CM

## 2019-10-20 DIAGNOSIS — E7800 Pure hypercholesterolemia, unspecified: Secondary | ICD-10-CM

## 2019-10-20 DIAGNOSIS — I340 Nonrheumatic mitral (valve) insufficiency: Secondary | ICD-10-CM | POA: Diagnosis not present

## 2019-10-20 DIAGNOSIS — R931 Abnormal findings on diagnostic imaging of heart and coronary circulation: Secondary | ICD-10-CM | POA: Diagnosis not present

## 2019-10-20 NOTE — Progress Notes (Signed)
[name] Date of Birth: 1940-10-07 MRN: [id number] Primary Care Provider:Eksir, Earnest Conroy, MD Former Cardiology Providers: Dr. Vear Clock Primary Cardiologist: Rex Kras, DO, [facility] (established care 08/10/2019)  Date: 10/20/19 Last Visit: 08/10/2019  Chief Complaint  Patient presents with  . Follow-up    1 month  . Results    HPI  Paige Carrillo is a 79 y.o.  female who presents to the office with a chief complaint of "1 month follow-up to reevaluate symptoms." Patient's past medical history and cardiovascular risk factors include: PVCs, HLD, hypothyroidism , postmeanpasual female, advance age.   Patient was previously under the care of Dr. Vear Clock which was transitioned to me back in June 2021.  Her EKG in the past noted sinus bradycardia with T wave inversion in the anteroseptal leads suggestive of possible ischemia.  Her overall functional capacity is excellent for her age but given the EKG changes nuclear stress test was recommended.    Clinically, patient's overall functional status remains the same and does not have any chest pain or shortness of breath at rest or with effort related activities.  Her nuclear stress test was reported to show normal myocardial perfusion but she did have post stress dilatation of the LV with a TID index of 1.25.  Per gated SPECT her LVEF was reported to be 33% with global hypokinesis and overall high risk stress test.  Due to reduced LVEF per gated SPECT and transient ischemic dilatation shared decision was proceed with coronary CTA and echocardiogram.  Patient decided to not undergo coronary CTA due to the current COVID-19 pandemic and did not want to be exposed to the hospital environment.  Clinically patient remained stable and walks about 3 to 4 hours/day ambulating approximately 4 to 6 miles.  Patient's overall endurance has remained stable and does not have any effort related symptoms.  Her echocardiogram noted preserved  LVEF with moderate mitral regurgitation.  Denies prior history of coronary artery disease, myocardial infarction, congestive heart failure, deep venous thrombosis, pulmonary embolism, stroke, transient ischemic attack.  FUNCTIONAL STATUS: She walks 4-89mle per day.    ALLERGIES: Allergies  Allergen Reactions  . Darvon [Propoxyphene] Other (See Comments)    "Felt weird"      MEDICATION LIST PRIOR TO VISIT: Current Outpatient Medications on File Prior to Visit  Medication Sig Dispense Refill  . aspirin EC 81 MG tablet Take 81 mg by mouth every other day.     . cetirizine (ZYRTEC) 10 MG tablet Take 10 mg by mouth daily.    .Marland Kitchenezetimibe (ZETIA) 10 MG tablet TAKE 1 TABLET(10 MG) BY MOUTH DAILY 90 tablet 1  . famotidine (PEPCID) 20 MG tablet Take 20 mg by mouth as needed for heartburn or indigestion.    . fluticasone (FLONASE ALLERGY RELIEF) 50 MCG/ACT nasal spray Place 1 spray into both nostrils daily.    . Fluticasone-Salmeterol (ADVAIR) 250-50 MCG/DOSE AEPB Inhale 1 puff into the lungs daily.     .Marland Kitchenlevothyroxine (SYNTHROID, LEVOTHROID) 112 MCG tablet Take 112 mcg by mouth daily.  0  . metoprolol tartrate (LOPRESSOR) 25 MG tablet TAKE 1 TABLET BY MOUTH TWICE DAILY 180 tablet 3  . nitroGLYCERIN (NITROSTAT) 0.4 MG SL tablet Place 1 tablet (0.4 mg total) under the tongue every 5 (five) minutes as needed for chest pain (up to 3 times). 21 tablet 0  . pravastatin (PRAVACHOL) 40 MG tablet TAKE 1 TABLET(40 MG) BY MOUTH DAILY 90 tablet 1  . PROAIR HFA 108 (90  Base) MCG/ACT inhaler Inhale 1-2 puffs into the lungs every 4 (four) hours as needed.  0   No current facility-administered medications on file prior to visit.    PAST MEDICAL HISTORY: Past Medical History:  Diagnosis Date  . Irregular heart rhythm     PAST SURGICAL HISTORY: History reviewed. No pertinent surgical history.  FAMILY HISTORY: The patient's family history includes Heart attack in her brother and father; Heart disease  in her father.   SOCIAL HISTORY:  The patient  reports that she has quit smoking. She has never used smokeless tobacco. She reports that she does not drink alcohol and does not use drugs.  Review of Systems  Constitutional: Negative for chills and fever.  HENT: Negative for hoarse voice and nosebleeds.   Eyes: Negative for discharge, double vision and pain.  Cardiovascular: Negative for chest pain, claudication, dyspnea on exertion, leg swelling, near-syncope, orthopnea, palpitations, paroxysmal nocturnal dyspnea and syncope.  Respiratory: Negative for hemoptysis and shortness of breath.   Musculoskeletal: Negative for muscle cramps and myalgias.  Gastrointestinal: Negative for abdominal pain, constipation, diarrhea, hematemesis, hematochezia, melena, nausea and vomiting.  Neurological: Negative for dizziness and light-headedness.   PHYSICAL EXAM: Vitals with BMI 10/20/2019 09/14/2019 08/10/2019  Height _0  _1  _2   Weight 170 lbs 171 lbs 3 oz 171 lbs  BMI 28.29 63.33 54.56  Systolic 256 389 373  Diastolic 63 67 72  Pulse 69 63 66    CONSTITUTIONAL: Well-developed and well-nourished. No acute distress.  SKIN: Skin is warm and dry. No rash noted. No cyanosis. No pallor. No jaundice HEAD: Normocephalic and atraumatic.  EYES: No scleral icterus MOUTH/THROAT: Moist oral membranes.  NECK: No JVD present. No thyromegaly noted. No carotid bruits  LYMPHATIC: No visible cervical adenopathy.  CHEST Normal respiratory effort. No intercostal retractions  LUNGS: Clear to auscultation bilaterally.  No stridor. No wheezes. No rales.  CARDIOVASCULAR: Regular rate and rhythm, positive S2-A7, soft holosystolic murmur heard at the apex, no rubs or gallops appreciated ABDOMINAL: No apparent ascites.  EXTREMITIES: No peripheral edema  HEMATOLOGIC: No significant bruising NEUROLOGIC: Oriented to person, place, and time. Nonfocal. Normal muscle tone.  PSYCHIATRIC: Normal mood and affect. Normal  behavior. Cooperative  CARDIAC DATABASE: EKG: 08/10/2019: Sinus bradycardia, 56 bpm, normal axis, poor R wave progression, T wave inversions in the anterior septal leads suggestive of possible ischemia, without underlying injury pattern.  Compared to prior EKGs T wave inversions in lead V2 and V3 are new.  Echocardiogram: 05/11/2014: LVEF 68%, grade 1 diastolic impairment, mild MR, mild MAC, trace TR.    09/17/2019: Left ventricle cavity is normal in size. Mild concentric hypertrophy of the left ventricle. Normal global wall motion. Normal LV systolic function with EF 65%. Doppler evidence of grade I (impaired) diastolic dysfunction, normal LAP.  Left atrial cavity is moderately dilated.  Calcification of posterior leaflet with reduced excursion. Moderate (Grade III) posteriorly directed mitral regurgitation.  Compared to previous study in 2016, mitral regurgitation increased from mild to moderate.  Stress Testing:  Lexiscan myoview stress test 03/29/2017: Perfusion imaging study demonstrates breast attenuation in the inferior wall, mild ischemia in the basal made and apical inferior wall could not be completely excluded however in view of the gated SPECT images reveal normal myocardial thickening and wall motion, most probably represents a soft tissue attenuation. LVEF was normal at 59%. This is a low risk study.  Lexiscan (Walking with mod Bruce)Tetrofosmin Stress Test  08/31/2019: Non-diagnostic ECG stress. Myocardial perfusion is normal.  There is abnormal post stress dilatation of the LV with TID index of 1.25. Overall LV systolic function is abnormal without regional wall motion abnormalities. Global hypokinesis of the left ventricle. Stress LV EF: 33%.  High risk study. Compared to the study done on 03/29/2017, previously diaphragmatic attenuation and normal LVEF.   LABORATORY DATA: CBC Latest Ref Rng & Units 03/27/2017 07/06/2016 06/30/2016  WBC 4.0 - 10.5 K/uL 6.9 9.5 11.9(H)   Hemoglobin 12.0 - 15.0 g/dL 14.4 13.8 13.5  Hematocrit 36 - 46 % 43.4 41.7 40.7  Platelets 150 - 400 K/uL 295 340 282    CMP Latest Ref Rng & Units 09/29/2019 03/27/2017 07/06/2016  Glucose 65 - 99 mg/dL 93 122(H) 107(H)  BUN 8 - 27 mg/dL 10 13 24(H)  Creatinine 0.57 - 1.00 mg/dL 0.96 0.90 0.75  Sodium 134 - 144 mmol/L 138 139 138  Potassium 3.5 - 5.2 mmol/L 4.2 4.0 4.1  Chloride 96 - 106 mmol/L 102 106 106  CO2 20 - 29 mmol/L _0 Calcium 8.7 - 10.3 mg/dL 9.6 9.4 9.6  Total Protein 6.5 - 8.1 g/dL - - -  Total Bilirubin 0.3 - 1.2 mg/dL - - -  Alkaline Phos 38 - 126 U/L - - -  AST 15 - 41 U/L - - -  ALT 14 - 54 U/L - - -    Lipid Panel  No results found for: CHOL, TRIG, HDL, CHOLHDL, VLDL, LDLCALC, LDLDIRECT, LABVLDL  No results found for: HGBA1C No components found for: NTPROBNP No results found for: TSH  Cardiac Panel (last 3 results) No results for input(s): CKTOTAL, CKMB, TROPONINIHS, RELINDX in the last 72 hours.  IMPRESSION:    ICD-10-CM   1. Abnormal nuclear cardiac imaging test  R93.1   2. PVC's (premature ventricular contractions)  I49.3   3. Hypercholesterolemia  E78.00   4. Nonrheumatic mitral valve regurgitation  I34.0 PCV ECHOCARDIOGRAM COMPLETE  5. Former smoker  Z87.891      RECOMMENDATIONS: Paige Carrillo is a 79 y.o. female whose past medical history and cardiovascular risk factors include: History of PVCs, hyperlipidemia, nonrheumatic mild mitral regurgitation, former smoker, postmenopausal female, advanced age.  Abnormal nuclear stress test:  In the past that shared decision was to proceed with coronary CTA to rule out obstructive CAD as the patient had transient ischemic dilatation and reduced LVEF per gated SPECT.    Since last office visit patient decided to not have the coronary CTA as she wanted to decrease her exposure to the hospital environment due to COVID-19 pandemic.    Clinically patient remained stable.  Echocardiogram in  the interim noted preserved LVEF without mention of regional wall motion abnormalities.  Patient is already on beta-blocker therapy.  Moderate mitral regurgitation:  Repeat echocardiogram in 1 year to reevaluate severity and progression of MR.  Currently patient is asymptomatic and LVEF is preserved.  Blood pressure at today's office visit is very well controlled.  Overall functional status remains stable.  We will continue to monitor.  PVCs: Patient remains asymptomatic and tolerating Lopressor well without any side effects or intolerances.  Hyperlipidemia: Continue statin therapy and Zetia.  Does not endorse myalgias.  Former smoker: Educated on the importance of continued smoking cessation.  FINAL MEDICATION LIST END OF ENCOUNTER: No orders of the defined types were placed in this encounter.   There are no discontinued medications.   Current Outpatient Medications:  .  aspirin EC 81 MG tablet, Take 81 mg by mouth every  other day. , Disp: , Rfl:  .  cetirizine (ZYRTEC) 10 MG tablet, Take 10 mg by mouth daily., Disp: , Rfl:  .  ezetimibe (ZETIA) 10 MG tablet, TAKE 1 TABLET(10 MG) BY MOUTH DAILY, Disp: 90 tablet, Rfl: 1 .  famotidine (PEPCID) 20 MG tablet, Take 20 mg by mouth as needed for heartburn or indigestion., Disp: , Rfl:  .  fluticasone (FLONASE ALLERGY RELIEF) 50 MCG/ACT nasal spray, Place 1 spray into both nostrils daily., Disp: , Rfl:  .  Fluticasone-Salmeterol (ADVAIR) 250-50 MCG/DOSE AEPB, Inhale 1 puff into the lungs daily. , Disp: , Rfl:  .  levothyroxine (SYNTHROID, LEVOTHROID) 112 MCG tablet, Take 112 mcg by mouth daily., Disp: , Rfl: 0 .  metoprolol tartrate (LOPRESSOR) 25 MG tablet, TAKE 1 TABLET BY MOUTH TWICE DAILY, Disp: 180 tablet, Rfl: 3 .  nitroGLYCERIN (NITROSTAT) 0.4 MG SL tablet, Place 1 tablet (0.4 mg total) under the tongue every 5 (five) minutes as needed for chest pain (up to 3 times)., Disp: 21 tablet, Rfl: 0 .  pravastatin (PRAVACHOL) 40 MG  tablet, TAKE 1 TABLET(40 MG) BY MOUTH DAILY, Disp: 90 tablet, Rfl: 1 .  PROAIR HFA 108 (90 Base) MCG/ACT inhaler, Inhale 1-2 puffs into the lungs every 4 (four) hours as needed., Disp: , Rfl: 0  Orders Placed This Encounter  Procedures  . PCV ECHOCARDIOGRAM COMPLETE   --Continue cardiac medications as reconciled in final medication list. --Return in about 1 year (around 10/19/2020) for Follow up for MR and review echo. . Or sooner if needed. --Continue follow-up with your primary care physician regarding the management of your other chronic comorbid conditions.  Patient's questions and concerns were addressed to her satisfaction. She voices understanding of the instructions provided during this encounter.   This note was created using a voice recognition software as a result there may be grammatical errors inadvertently enclosed that do not reflect the nature of this encounter. Every attempt is made to correct such errors.  Rex Kras, [place], [facility]  Pager: [phone] Office: [phone]

## 2019-12-08 DIAGNOSIS — I700 Atherosclerosis of aorta: Secondary | ICD-10-CM | POA: Diagnosis not present

## 2019-12-08 DIAGNOSIS — E063 Autoimmune thyroiditis: Secondary | ICD-10-CM | POA: Diagnosis not present

## 2019-12-08 DIAGNOSIS — E038 Other specified hypothyroidism: Secondary | ICD-10-CM | POA: Diagnosis not present

## 2019-12-11 DIAGNOSIS — H5211 Myopia, right eye: Secondary | ICD-10-CM | POA: Diagnosis not present

## 2019-12-11 DIAGNOSIS — H2513 Age-related nuclear cataract, bilateral: Secondary | ICD-10-CM | POA: Diagnosis not present

## 2020-02-10 DIAGNOSIS — Z0000 Encounter for general adult medical examination without abnormal findings: Secondary | ICD-10-CM | POA: Diagnosis not present

## 2020-02-10 DIAGNOSIS — E039 Hypothyroidism, unspecified: Secondary | ICD-10-CM | POA: Diagnosis not present

## 2020-02-19 ENCOUNTER — Emergency Department (HOSPITAL_COMMUNITY)
Admission: EM | Admit: 2020-02-19 | Discharge: 2020-02-20 | Disposition: A | Discharge status: Home / Self Care | Payer: PPO | Attending: Emergency Medicine | Admitting: Emergency Medicine

## 2020-02-19 ENCOUNTER — Other Ambulatory Visit: Payer: Self-pay

## 2020-02-19 DIAGNOSIS — R0789 Other chest pain: Secondary | ICD-10-CM | POA: Diagnosis not present

## 2020-02-19 DIAGNOSIS — M79601 Pain in right arm: Secondary | ICD-10-CM | POA: Insufficient documentation

## 2020-02-19 DIAGNOSIS — Z5321 Procedure and treatment not carried out due to patient leaving prior to being seen by health care provider: Secondary | ICD-10-CM | POA: Insufficient documentation

## 2020-02-19 LAB — BASIC METABOLIC PANEL
Anion gap: 8 (ref 5–15)
BUN: 12 mg/dL (ref 8–23)
CO2: 27 mmol/L (ref 22–32)
Calcium: 9.6 mg/dL (ref 8.9–10.3)
Chloride: 105 mmol/L (ref 98–111)
Creatinine, Ser: 1.02 mg/dL — ABNORMAL HIGH (ref 0.44–1.00)
GFR, Estimated: 56 mL/min — ABNORMAL LOW (ref >60)
Glucose, Bld: 124 mg/dL — ABNORMAL HIGH (ref 70–99)
Potassium: 3.9 mmol/L (ref 3.5–5.1)
Sodium: 140 mmol/L (ref 135–145)

## 2020-02-19 LAB — CBC
HCT: 42.4 % (ref 36.0–46.0)
Hemoglobin: 13.6 g/dL (ref 12.0–15.0)
MCH: 32.3 pg (ref 26.0–34.0)
MCHC: 32.1 g/dL (ref 30.0–36.0)
MCV: 100.7 fL — ABNORMAL HIGH (ref 80.0–100.0)
Platelets: 273 10*3/uL (ref 150–400)
RBC: 4.21 MIL/uL (ref 3.87–5.11)
RDW: 13 % (ref 11.5–15.5)
WBC: 6.1 10*3/uL (ref 4.0–10.5)
nRBC: 0 % (ref 0.0–0.2)

## 2020-02-19 LAB — TROPONIN I (HIGH SENSITIVITY)
Troponin I (High Sensitivity): 3 ng/L (ref <18)
Troponin I (High Sensitivity): 3 ng/L (ref <18)

## 2020-02-19 NOTE — ED Triage Notes (Signed)
Pt here with reports of chest heaviness and arm heaviness after walking today. Pt took 2 aspirin. Called cardiologist and was instructed to come here.

## 2020-07-08 ENCOUNTER — Other Ambulatory Visit: Payer: PPO

## 2020-08-09 ENCOUNTER — Ambulatory Visit: Payer: PPO | Admitting: Cardiology

## 2020-09-05 ENCOUNTER — Other Ambulatory Visit: Payer: Self-pay | Admitting: Cardiology

## 2020-09-14 ENCOUNTER — Other Ambulatory Visit: Payer: Self-pay | Admitting: Cardiology

## 2020-09-14 DIAGNOSIS — I493 Ventricular premature depolarization: Secondary | ICD-10-CM

## 2020-10-10 ENCOUNTER — Other Ambulatory Visit: Payer: Self-pay

## 2020-10-10 ENCOUNTER — Ambulatory Visit: Payer: PPO

## 2020-10-10 DIAGNOSIS — I340 Nonrheumatic mitral (valve) insufficiency: Secondary | ICD-10-CM

## 2020-10-14 NOTE — Progress Notes (Signed)
Called and spoke with patient regarding her echocardiogram results. Patient confirmed that she will be here for her next office visit.

## 2020-10-19 ENCOUNTER — Ambulatory Visit: Payer: PPO | Admitting: Cardiology

## 2020-10-19 ENCOUNTER — Encounter: Payer: Self-pay | Admitting: Cardiology

## 2020-10-19 ENCOUNTER — Other Ambulatory Visit: Payer: Self-pay

## 2020-10-19 VITALS — BP 144/64 | HR 64 | Temp 98.2°F | Ht 65.0 in | Wt 166.0 lb

## 2020-10-19 DIAGNOSIS — R931 Abnormal findings on diagnostic imaging of heart and coronary circulation: Secondary | ICD-10-CM | POA: Diagnosis not present

## 2020-10-19 DIAGNOSIS — I493 Ventricular premature depolarization: Secondary | ICD-10-CM

## 2020-10-19 DIAGNOSIS — E7800 Pure hypercholesterolemia, unspecified: Secondary | ICD-10-CM

## 2020-10-19 DIAGNOSIS — Z87891 Personal history of nicotine dependence: Secondary | ICD-10-CM | POA: Diagnosis not present

## 2020-10-19 DIAGNOSIS — I340 Nonrheumatic mitral (valve) insufficiency: Secondary | ICD-10-CM | POA: Diagnosis not present

## 2020-10-19 NOTE — Progress Notes (Signed)
Paige Carrillo Date of Birth: 1940/11/02 MRN: [id number] Primary Care Provider:Eksir, Earnest Conroy, MD Former Cardiology Providers: Dr. Vear Clock Primary Cardiologist: Rex Kras, DO, [facility] (established care 08/10/2019)  Date: 10/19/20 Last Office Visit: 10/20/2019  Chief Complaint  Patient presents with   pvc   Follow-up   mitral regurgitatioin     HPI  Paige Carrillo is a 80 y.o.  female who presents to the office with a chief complaint of "1 year follow-up for PVCs and MR." Patient's past medical history and cardiovascular risk factors include: PVCs, mitral regurgitation, HLD, hypothyroidism , postmeanpasual female, advance age.   Patient presents today for 1 year follow-up as she is currently being managed for PVCs, mitral regurgitation, and hypercholesterolemia.  Over the last 1 year patient states that she is doing well from a cardiovascular standpoint.  However, in December 2021 she had a ER visit due to left arm pain.  She had pulmonary blood work done which noted negative high sensitive troponin x2 and after prolonged ER wait she decides to go home no reoccurrence of symptoms.  Patient is tolerating her cholesterol medication well without any side effects or intolerances.  She has not had a recent lipid profile performed as to her knowledge.  She also had an echocardiogram last office visit to monitor her moderate mitral regurgitation.  However, the most recent echocardiogram notes mild MR with preserved LVEF.  The findings were discussed in detail with the patient.  In the past patient was noted to have an abnormal due to post-rest dilatation of the LV with a TID ratio of 1.25, and gated SPECT images noted an LVEF of 33% with global hypokinesis.  In the past we discussed undergoing coronary CTA; however, due to the COVID-19 pandemic patient chose to not undergo any additional testing as clinically she was doing well.  She feels the same.  Since last visit she is  now walking 7 to 10 miles per day or covering about 20,000 steps per day.  FUNCTIONAL STATUS: She walks 7-10 mile per day or 20,000 steps per day.     ALLERGIES: Allergies  Allergen Reactions   Darvon [Propoxyphene] Other (See Comments)    "Felt weird"     MEDICATION LIST PRIOR TO VISIT: Current Outpatient Medications on File Prior to Visit  Medication Sig Dispense Refill   aspirin EC 81 MG tablet Take 81 mg by mouth every other day. Every 3 days     cetirizine (ZYRTEC) 10 MG tablet Take 10 mg by mouth daily.     ezetimibe (ZETIA) 10 MG tablet TAKE 1 TABLET(10 MG) BY MOUTH DAILY 90 tablet 1   famotidine (PEPCID) 20 MG tablet Take 20 mg by mouth as needed for heartburn or indigestion.     fluticasone (FLONASE) 50 MCG/ACT nasal spray Place 1 spray into both nostrils daily.     Fluticasone-Salmeterol (ADVAIR) 250-50 MCG/DOSE AEPB Inhale 1 puff into the lungs daily.      levothyroxine (SYNTHROID) 125 MCG tablet Take 125 mcg by mouth daily.     metoprolol tartrate (LOPRESSOR) 25 MG tablet TAKE 1 TABLET BY MOUTH TWICE DAILY 180 tablet 0   nitroGLYCERIN (NITROSTAT) 0.4 MG SL tablet Place 1 tablet (0.4 mg total) under the tongue every 5 (five) minutes as needed for chest pain (up to 3 times). 21 tablet 0   pravastatin (PRAVACHOL) 40 MG tablet TAKE 1 TABLET(40 MG) BY MOUTH DAILY 90 tablet 1   PROAIR HFA 108 (90 Base) MCG/ACT inhaler Inhale  1-2 puffs into the lungs every 4 (four) hours as needed.  0   No current facility-administered medications on file prior to visit.    PAST MEDICAL HISTORY: Past Medical History:  Diagnosis Date   Hyperlipidemia    Hypothyroidism    Mitral regurgitation    PVC (premature ventricular contraction)     PAST SURGICAL HISTORY: History reviewed. No pertinent surgical history.  FAMILY HISTORY: The patient's family history includes Heart attack in her brother and father; Heart disease in her father.   SOCIAL HISTORY:  The patient  reports that she has  quit smoking. She has never used smokeless tobacco. She reports that she does not drink alcohol and does not use drugs.  Review of Systems  Constitutional: Negative for chills and fever.  HENT:  Negative for hoarse voice and nosebleeds.   Eyes:  Negative for discharge, double vision and pain.  Cardiovascular:  Negative for chest pain, claudication, dyspnea on exertion, leg swelling, near-syncope, orthopnea, palpitations, paroxysmal nocturnal dyspnea and syncope.  Respiratory:  Negative for hemoptysis and shortness of breath.   Musculoskeletal:  Negative for muscle cramps and myalgias.  Gastrointestinal:  Negative for abdominal pain, constipation, diarrhea, hematemesis, hematochezia, melena, nausea and vomiting.  Neurological:  Negative for dizziness and light-headedness.  PHYSICAL EXAM: Vitals with BMI 10/19/2020 02/19/2020 02/19/2020  Height 5' 5" - -  Weight 166 lbs - -  BMI 03.50 - -  Systolic 093 818 299  Diastolic 64 62 67  Pulse 64 56 59    CONSTITUTIONAL: Well-developed and well-nourished. No acute distress.  SKIN: Skin is warm and dry. No rash noted. No cyanosis. No pallor. No jaundice HEAD: Normocephalic and atraumatic.  EYES: No scleral icterus MOUTH/THROAT: Moist oral membranes.  NECK: No JVD present. No thyromegaly noted. No carotid bruits  LYMPHATIC: No visible cervical adenopathy.  CHEST Normal respiratory effort. No intercostal retractions  LUNGS: Clear to auscultation bilaterally.  No stridor. No wheezes. No rales.  CARDIOVASCULAR: Regular rate and rhythm, positive B7-J6, soft holosystolic murmur heard at the apex, no rubs or gallops appreciated ABDOMINAL: No apparent ascites.  EXTREMITIES: No peripheral edema  HEMATOLOGIC: No significant bruising NEUROLOGIC: Oriented to person, place, and time. Nonfocal. Normal muscle tone.  PSYCHIATRIC: Normal mood and affect. Normal behavior. Cooperative  CARDIAC DATABASE: EKG: 10/19/2020: Normal sinus rhythm, 65 bpm, normal  axis, low voltage in the precordial leads, TWI anteroseptal/anterior leads cannot rule out ischemia, rare PACs.  Similar findings on prior ECG dated 08/2019  Echocardiogram: 10/10/2020:  Normal LV systolic function with visual EF 60-65%. Left ventricle cavity is normal in size. Normal left ventricular wall thickness. Normal global  wall motion. Normal diastolic filling pattern, normal LAP.  Mild (Grade I) mitral regurgitation.  Small pericardial effusion. There is no hemodynamic significance.  Compared to study 09/17/2019: Moderate MR is now mild and moderate LAE is  now within normal limits. Otherwise no significant change.   Stress Testing:  Lexiscan myoview stress test 03/29/2017: Perfusion imaging study demonstrates breast attenuation in the inferior wall, mild ischemia in the basal made and apical inferior wall could not be completely excluded however in view of the gated SPECT images reveal normal myocardial thickening and wall motion, most probably represents a soft tissue attenuation.  LVEF was normal at 59%.  This is a low risk study.  Lexiscan (Walking with mod Bruce)Tetrofosmin Stress Test  08/31/2019: Non-diagnostic ECG stress. Myocardial perfusion is normal. There is abnormal post stress dilatation of the LV with TID index of 1.25. Overall  LV systolic function is abnormal without regional wall motion abnormalities. Global hypokinesis of the left ventricle. Stress LV EF: 33%.  High risk study. Compared to the study done on 03/29/2017, previously diaphragmatic attenuation and normal LVEF.   LABORATORY DATA: CBC Latest Ref Rng & Units 02/19/2020 03/27/2017 07/06/2016  WBC 4.0 - 10.5 K/uL 6.1 6.9 9.5  Hemoglobin 12.0 - 15.0 g/dL 13.6 14.4 13.8  Hematocrit 36.0 - 46.0 % 42.4 43.4 41.7  Platelets 150 - 400 K/uL 273 295 340    CMP Latest Ref Rng & Units 02/19/2020 09/29/2019 03/27/2017  Glucose 70 - 99 mg/dL 124(H) 93 122(H)  BUN 8 - 23 mg/dL _0 Creatinine 0.44 - 1.00 mg/dL  1.02(H) 0.96 0.90  Sodium 135 - 145 mmol/L 140 138 139  Potassium 3.5 - 5.1 mmol/L 3.9 4.2 4.0  Chloride 98 - 111 mmol/L 105 102 106  CO2 22 - 32 mmol/L _1 Calcium 8.9 - 10.3 mg/dL 9.6 9.6 9.4  Total Protein 6.5 - 8.1 g/dL - - -  Total Bilirubin 0.3 - 1.2 mg/dL - - -  Alkaline Phos 38 - 126 U/L - - -  AST 15 - 41 U/L - - -  ALT 14 - 54 U/L - - -    Lipid Panel  No results found for: CHOL, TRIG, HDL, CHOLHDL, VLDL, LDLCALC, LDLDIRECT, LABVLDL  No results found for: HGBA1C No components found for: NTPROBNP No results found for: TSH  Cardiac Panel (last 3 results) No results for input(s): CKTOTAL, CKMB, TROPONINIHS, RELINDX in the last 72 hours.  IMPRESSION:    ICD-10-CM   1. Nonrheumatic mitral valve regurgitation  I34.0 EKG 12-Lead    2. PVC's (premature ventricular contractions)  I49.3 EKG 12-Lead    3. Hypercholesterolemia  E78.00 Lipid Panel With LDL/HDL Ratio    LDL cholesterol, direct    CMP14+EGFR    4. Former smoker  Z87.891        RECOMMENDATIONS: Paige Carrillo is a 80 y.o. female whose past medical history and cardiovascular risk factors include: History of PVCs, hyperlipidemia, nonrheumatic mild mitral regurgitation, former smoker, postmenopausal female, advanced age.  Mitral regurgitation: Recent echocardiogram results reviewed  Severity of MR has improved compared to prior study  No additional testing warranted at this time.   Continue to monitor.    PACs/PVCs: Currently on Lopressor.  We discussed transitioning to Toprol-XL the patient feels comfortable on her current medical therapy.  Continue to monitor.    Hyperlipidemia: Check fasting lipid profile.  Continue statin therapy and Zetia.  Does not endorse myalgias.  Patient has had a history of an abnormal stress test in the past and has denied to undergo coronary CTA due to the COVID-19 pandemic.  However now its been at least 1 year and she has remained relatively stable.  Her  functional status has actually improved compared to prior and therefore the shared decision was to monitor her symptoms for now and consider additional testing testing if change in clinical status.  Former smoker: Educated on the importance of continued smoking cessation.  FINAL MEDICATION LIST END OF ENCOUNTER: No orders of the defined types were placed in this encounter.   Medications Discontinued During This Encounter  Medication Reason   levothyroxine (SYNTHROID, LEVOTHROID) 112 MCG tablet Error     Current Outpatient Medications:    aspirin EC 81 MG tablet, Take 81 mg by mouth every other day. Every 3 days, Disp: , Rfl:    cetirizine (  ZYRTEC) 10 MG tablet, Take 10 mg by mouth daily., Disp: , Rfl:    ezetimibe (ZETIA) 10 MG tablet, TAKE 1 TABLET(10 MG) BY MOUTH DAILY, Disp: 90 tablet, Rfl: 1   famotidine (PEPCID) 20 MG tablet, Take 20 mg by mouth as needed for heartburn or indigestion., Disp: , Rfl:    fluticasone (FLONASE) 50 MCG/ACT nasal spray, Place 1 spray into both nostrils daily., Disp: , Rfl:    Fluticasone-Salmeterol (ADVAIR) 250-50 MCG/DOSE AEPB, Inhale 1 puff into the lungs daily. , Disp: , Rfl:    levothyroxine (SYNTHROID) 125 MCG tablet, Take 125 mcg by mouth daily., Disp: , Rfl:    metoprolol tartrate (LOPRESSOR) 25 MG tablet, TAKE 1 TABLET BY MOUTH TWICE DAILY, Disp: 180 tablet, Rfl: 0   nitroGLYCERIN (NITROSTAT) 0.4 MG SL tablet, Place 1 tablet (0.4 mg total) under the tongue every 5 (five) minutes as needed for chest pain (up to 3 times)., Disp: 21 tablet, Rfl: 0   pravastatin (PRAVACHOL) 40 MG tablet, TAKE 1 TABLET(40 MG) BY MOUTH DAILY, Disp: 90 tablet, Rfl: 1   PROAIR HFA 108 (90 Base) MCG/ACT inhaler, Inhale 1-2 puffs into the lungs every 4 (four) hours as needed., Disp: , Rfl: 0  Orders Placed This Encounter  Procedures   Lipid Panel With LDL/HDL Ratio   LDL cholesterol, direct   CMP14+EGFR   EKG 12-Lead   --Continue cardiac medications as reconciled in final  medication list. --Return in about 1 year (around 10/19/2021) for Follow up, Lipid, MR. Or sooner if needed. --Continue follow-up with your primary care physician regarding the management of your other chronic comorbid conditions.  Patient's questions and concerns were addressed to her satisfaction. She voices understanding of the instructions provided during this encounter.   This note was created using a voice recognition software as a result there may be grammatical errors inadvertently enclosed that do not reflect the nature of this encounter. Every attempt is made to correct such errors.  Rex Kras, [place], [facility]  Pager: [phone] Office: [phone]

## 2020-10-27 DIAGNOSIS — E7800 Pure hypercholesterolemia, unspecified: Secondary | ICD-10-CM | POA: Diagnosis not present

## 2020-10-28 LAB — CMP14+EGFR
ALT: 13 IU/L (ref 0–32)
AST: 14 IU/L (ref 0–40)
Albumin/Globulin Ratio: 1.8 (ref 1.2–2.2)
Albumin: 4.1 g/dL (ref 3.7–4.7)
Alkaline Phosphatase: 53 IU/L (ref 44–121)
BUN/Creatinine Ratio: 19 (ref 12–28)
BUN: 16 mg/dL (ref 8–27)
Bilirubin Total: 0.4 mg/dL (ref 0.0–1.2)
CO2: 24 mmol/L (ref 20–29)
Calcium: 9.5 mg/dL (ref 8.7–10.3)
Chloride: 105 mmol/L (ref 96–106)
Creatinine, Ser: 0.84 mg/dL (ref 0.57–1.00)
Globulin, Total: 2.3 g/dL (ref 1.5–4.5)
Glucose: 99 mg/dL (ref 65–99)
Potassium: 4.3 mmol/L (ref 3.5–5.2)
Sodium: 141 mmol/L (ref 134–144)
Total Protein: 6.4 g/dL (ref 6.0–8.5)
eGFR: 71 mL/min/{1.73_m2} (ref >59)

## 2020-10-28 LAB — LIPID PANEL WITH LDL/HDL RATIO
Cholesterol, Total: 200 mg/dL — ABNORMAL HIGH (ref 100–199)
HDL: 58 mg/dL (ref >39)
LDL Chol Calc (NIH): 125 mg/dL — ABNORMAL HIGH (ref 0–99)
LDL/HDL Ratio: 2.2 ratio (ref 0.0–3.2)
Triglycerides: 94 mg/dL (ref 0–149)
VLDL Cholesterol Cal: 17 mg/dL (ref 5–40)

## 2020-10-28 LAB — LDL CHOLESTEROL, DIRECT: LDL Direct: 109 mg/dL — ABNORMAL HIGH (ref 0–99)

## 2020-10-31 NOTE — Progress Notes (Signed)
Spoke to patient she is aware of results she will call back to reschedule appt when she knows her schedule

## 2020-10-31 NOTE — Progress Notes (Signed)
No answer left a vm

## 2020-12-12 ENCOUNTER — Other Ambulatory Visit: Payer: Self-pay | Admitting: Cardiology

## 2020-12-12 DIAGNOSIS — I493 Ventricular premature depolarization: Secondary | ICD-10-CM

## 2020-12-12 DIAGNOSIS — H10413 Chronic giant papillary conjunctivitis, bilateral: Secondary | ICD-10-CM | POA: Diagnosis not present

## 2020-12-12 DIAGNOSIS — H5211 Myopia, right eye: Secondary | ICD-10-CM | POA: Diagnosis not present

## 2020-12-12 DIAGNOSIS — H25813 Combined forms of age-related cataract, bilateral: Secondary | ICD-10-CM | POA: Diagnosis not present

## 2021-02-10 DIAGNOSIS — Z0000 Encounter for general adult medical examination without abnormal findings: Secondary | ICD-10-CM | POA: Diagnosis not present

## 2021-02-10 DIAGNOSIS — E063 Autoimmune thyroiditis: Secondary | ICD-10-CM | POA: Diagnosis not present

## 2021-02-10 DIAGNOSIS — Z87891 Personal history of nicotine dependence: Secondary | ICD-10-CM | POA: Diagnosis not present

## 2021-02-10 DIAGNOSIS — Z1159 Encounter for screening for other viral diseases: Secondary | ICD-10-CM | POA: Diagnosis not present

## 2021-02-10 DIAGNOSIS — I700 Atherosclerosis of aorta: Secondary | ICD-10-CM | POA: Diagnosis not present

## 2021-02-10 DIAGNOSIS — E038 Other specified hypothyroidism: Secondary | ICD-10-CM | POA: Diagnosis not present

## 2021-02-10 DIAGNOSIS — I493 Ventricular premature depolarization: Secondary | ICD-10-CM | POA: Diagnosis not present

## 2021-02-10 DIAGNOSIS — E782 Mixed hyperlipidemia: Secondary | ICD-10-CM | POA: Diagnosis not present

## 2021-02-10 DIAGNOSIS — Z780 Asymptomatic menopausal state: Secondary | ICD-10-CM | POA: Diagnosis not present

## 2021-02-10 DIAGNOSIS — J4530 Mild persistent asthma, uncomplicated: Secondary | ICD-10-CM | POA: Diagnosis not present

## 2021-02-10 DIAGNOSIS — M8580 Other specified disorders of bone density and structure, unspecified site: Secondary | ICD-10-CM | POA: Diagnosis not present

## 2021-02-10 DIAGNOSIS — Z5181 Encounter for therapeutic drug level monitoring: Secondary | ICD-10-CM | POA: Diagnosis not present

## 2021-02-14 ENCOUNTER — Other Ambulatory Visit: Payer: Self-pay | Admitting: Family Medicine

## 2021-02-14 DIAGNOSIS — E2839 Other primary ovarian failure: Secondary | ICD-10-CM

## 2021-02-14 DIAGNOSIS — M8580 Other specified disorders of bone density and structure, unspecified site: Secondary | ICD-10-CM

## 2021-03-09 ENCOUNTER — Other Ambulatory Visit: Payer: Self-pay | Admitting: Cardiology

## 2021-03-09 DIAGNOSIS — I493 Ventricular premature depolarization: Secondary | ICD-10-CM

## 2021-03-11 ENCOUNTER — Other Ambulatory Visit: Payer: Self-pay | Admitting: Cardiology

## 2021-03-22 ENCOUNTER — Ambulatory Visit
Admission: RE | Admit: 2021-03-22 | Discharge: 2021-03-22 | Disposition: A | Discharge status: Home / Self Care | Payer: PPO | Source: Ambulatory Visit | Attending: Family Medicine | Admitting: Family Medicine

## 2021-03-22 DIAGNOSIS — E2839 Other primary ovarian failure: Secondary | ICD-10-CM

## 2021-03-22 DIAGNOSIS — M8580 Other specified disorders of bone density and structure, unspecified site: Secondary | ICD-10-CM

## 2021-05-01 ENCOUNTER — Ambulatory Visit: Payer: PPO | Admitting: Student

## 2021-07-07 ENCOUNTER — Encounter (HOSPITAL_BASED_OUTPATIENT_CLINIC_OR_DEPARTMENT_OTHER): Payer: Self-pay

## 2021-07-07 ENCOUNTER — Emergency Department (HOSPITAL_BASED_OUTPATIENT_CLINIC_OR_DEPARTMENT_OTHER)
Admission: EM | Admit: 2021-07-07 | Discharge: 2021-07-07 | Disposition: A | Discharge status: Home / Self Care | Payer: PPO | Attending: Emergency Medicine | Admitting: Emergency Medicine

## 2021-07-07 ENCOUNTER — Other Ambulatory Visit: Payer: Self-pay

## 2021-07-07 DIAGNOSIS — M5441 Lumbago with sciatica, right side: Secondary | ICD-10-CM | POA: Insufficient documentation

## 2021-07-07 DIAGNOSIS — Z7982 Long term (current) use of aspirin: Secondary | ICD-10-CM | POA: Insufficient documentation

## 2021-07-07 DIAGNOSIS — M5431 Sciatica, right side: Secondary | ICD-10-CM

## 2021-07-07 DIAGNOSIS — M545 Low back pain, unspecified: Secondary | ICD-10-CM | POA: Diagnosis present

## 2021-07-07 MED ORDER — KETOROLAC TROMETHAMINE 15 MG/ML IJ SOLN
15.0000 mg | Freq: Once | INTRAMUSCULAR | Status: AC
  Administered 2021-07-07: 15 mg via INTRAMUSCULAR
  Filled 2021-07-07: qty 1

## 2021-07-07 MED ORDER — LIDOCAINE 5 % EX PTCH
1.0000 | MEDICATED_PATCH | CUTANEOUS | Status: DC
  Administered 2021-07-07: 1 via TRANSDERMAL
  Filled 2021-07-07: qty 1

## 2021-07-07 MED ORDER — LIDOCAINE 5 % EX PTCH: 1.0000 | MEDICATED_PATCH | CUTANEOUS | 0 refills | Status: DC

## 2021-07-07 NOTE — ED Provider Notes (Addendum)
?[facility] EM[facility] DEPT ?Provider Note ? ? ?CSN: [id number] ?Arrival date & time: 07/07/21  1737 ? ?  ? ?History ? ?Chief Complaint  ?Patient presents with  ? Back Pain  ? ? ?Paige Carrillo is a 81 y.o. female. ? ? ?Back Pain ?Associated symptoms: no fever   ? ?Patient presents to the ED for evaluation of back pain.  Patient states she has been having trouble for the last month and a half.  Initially she was having symptoms more on the left side.  In the last couple weeks she started having pain more on the right side.  Its more in her right back and right leg.  Pain in her back is less than the pain she experiences in her leg however.  She has seen an orthopedic doctor and has had x-rays.  Patient was told the neck step will be an MRI.  Today the symptoms were worse and she was having more severe pain radiating when she tried to walk.  She does have medications including tramadol muscle relaxer and is taking steroids.  Patient has been using a cane but is concerned that she gets unsteady with that.  No fevers or chills.  No numbness or weakness.  No abdominal pain.  No swelling. ? ?Home Medications ?Prior to Admission medications   ?Medication Sig Start Date End Date Taking? Authorizing Provider  ?lidocaine (LIDODERM) 5 % Place 1 patch onto the skin daily. Remove & Discard patch within 12 hours or as directed by MD 07/07/21  Yes Linwood Dibbles, MD  ?aspirin EC 81 MG tablet Take 81 mg by mouth every other day. Every 3 days    [provider]  ?cetirizine (ZYRTEC) 10 MG tablet Take 10 mg by mouth daily.    [provider]  ?ezetimibe (ZETIA) 10 MG tablet TAKE 1 TABLET(10 MG) BY MOUTH DAILY 12/13/20   Tolia, Sunit, DO  ?famotidine (PEPCID) 20 MG tablet Take 20 mg by mouth as needed for heartburn or indigestion.    [provider]  ?fluticasone (FLONASE) 50 MCG/ACT nasal spray Place 1 spray into both nostrils daily.    [provider]  ?Fluticasone-Salmeterol  (ADVAIR) 250-50 MCG/DOSE AEPB Inhale 1 puff into the lungs daily.     [provider]  ?levothyroxine (SYNTHROID) 125 MCG tablet Take 125 mcg by mouth daily. 09/06/20   [provider]  ?metoprolol tartrate (LOPRESSOR) 25 MG tablet TAKE 1 TABLET BY MOUTH TWICE DAILY 03/09/21   Tolia, Sunit, DO  ?nitroGLYCERIN (NITROSTAT) 0.4 MG SL tablet Place 1 tablet (0.4 mg total) under the tongue every 5 (five) minutes as needed for chest pain (up to 3 times). 03/27/17   Tegeler, Canary Brim, MD  ?pravastatin (PRAVACHOL) 40 MG tablet TAKE 1 TABLET(40 MG) BY MOUTH DAILY 03/13/21   Tolia, Sunit, DO  ?PROAIR HFA 108 (90 Base) MCG/ACT inhaler Inhale 1-2 puffs into the lungs every 4 (four) hours as needed. 03/08/15   [provider]  ?   ? ?Allergies    ?Darvon [propoxyphene]   ? ?Review of Systems   ?Review of Systems  ?Constitutional:  Negative for fever.  ?Musculoskeletal:  Positive for back pain.  ? ?Physical Exam ?Updated Vital Signs ?BP (!) 150/75 (BP Location: Right Arm)   Pulse 72   Temp 98.2 ?F (36.8 ?C)   Resp 16   Ht 1.651 m (5\' 5" )   Wt 75.3 kg   SpO2 99%   BMI 27.62 kg/m?  ?Physical Exam ?Vitals and  nursing note reviewed.  ?Constitutional:   ?   General: She is not in acute distress. ?   Appearance: She is well-developed.  ?HENT:  ?   Head: Normocephalic and atraumatic.  ?   Right Ear: External ear normal.  ?   Left Ear: External ear normal.  ?Eyes:  ?   General: No scleral icterus.    ?   Right eye: No discharge.     ?   Left eye: No discharge.  ?   Conjunctiva/sclera: Conjunctivae normal.  ?Neck:  ?   Trachea: No tracheal deviation.  ?Cardiovascular:  ?   Rate and Rhythm: Normal rate.  ?Pulmonary:  ?   Effort: Pulmonary effort is normal. No respiratory distress.  ?   Breath sounds: No stridor.  ?Abdominal:  ?   General: There is no distension.  ?Musculoskeletal:     ?   General: No swelling or deformity.  ?   Cervical back: Neck supple.  ?   Comments: Strong dorsalis pedal pulse, extremities  warm and well-perfused, mild tenderness right paraspinal lumbar region, no tenderness or swelling in the right thigh or right lower leg  ?Skin: ?   General: Skin is warm and dry.  ?   Findings: No rash.  ?Neurological:  ?   General: No focal deficit present.  ?   Mental Status: She is alert.  ?   Cranial Nerves: Cranial nerve deficit: no gross deficits.  ?   Sensory: No sensory deficit.  ?   Motor: No weakness.  ? ? ?ED Results / Procedures / Treatments   ?Labs ?(all labs ordered are listed, but only abnormal results are displayed) ?Labs Reviewed - No data to display ? ?EKG ?None ? ?Radiology ?No results found. ? ?Procedures ?Procedures  ? ? ?Medications Ordered in ED ?Medications  ?lidocaine (LIDODERM) 5 % 1 patch (1 patch Transdermal Patch Applied 07/07/21 1853)  ? ? ?ED Course/ Medical Decision Making/ A&P ?  ?                        ?Medical Decision Making ?Risk ?Prescription drug management. ? ? ?He does not have any acute neuro or vascular deficits noted on exam.  No signs of infection.  Her symptoms are suggestive of sciatica.  Patient has been given a prescription for tramadol.  She states she has history of alcoholism and does not want to take any opiate pain medications.  Patient is agreeable to trying to use a lidocaine patch.  I also recommended using a walker instead of a cane as that might be a little bit easier for her to manage.  Patient will follow-up with her orthopedic doctor for further evaluation. ? ? ? ? ? ? ? ?Final Clinical Impression(s) / ED Diagnoses ?Final diagnoses:  ?Sciatica of right side  ? ? ?Rx / DC Orders ?ED Discharge Orders   ? ?      Ordered  ?  lidocaine (LIDODERM) 5 %  Every 24 hours       ? 07/07/21 1916  ? ?  ?  ? ?  ? ? ?  ?Linwood Dibbles, MD ?07/07/21 1919 ? ?Pt requested toradol shot prior to discharge ?  ?Linwood Dibbles, MD ?07/07/21 1942 ? ?

## 2021-07-07 NOTE — ED Notes (Addendum)
Attempted to discharge patient at this time. Patient is now requesting a shot for pain. Awaiting MD to return to desk. Patient made aware of delay. ? ?1952: Patient medicated for pain at this time. No acute distress noted. Patient opted not to stay for 15 minute recheck for pain/reaction. Patient understands to return with any worsening or red flag allergy symptoms.  ?Patient ambulated out to waiting room with daughter using came without incident.  ?

## 2021-07-07 NOTE — ED Triage Notes (Signed)
Patient here POV from Home. ? ?Endorses Left Sided Back Pain for approximately 1.5 months. Responded well with Steroids and then the Patient began to have Right Sided Back Pain. Pain is Right Lower Back and radiating to Right Leg.  ? ?Given Several Medications including Steroids, Tramadol, Muscle Relaxer's.  ? ?NAD Noted during Triage. A&Ox4. GCS 15. Ambulatory with Cane. ?

## 2021-07-07 NOTE — Discharge Instructions (Addendum)
Continue your medications that you have at home.  Use lidocaine patches to help with your pain.  Follow-up with your orthopedic doctor for further evaluation. ?

## 2021-09-08 ENCOUNTER — Other Ambulatory Visit: Payer: Self-pay | Admitting: Cardiology

## 2021-10-03 ENCOUNTER — Encounter: Payer: Self-pay | Admitting: Cardiology

## 2021-10-03 ENCOUNTER — Ambulatory Visit: Payer: PPO | Admitting: Cardiology

## 2021-10-03 VITALS — BP 126/58 | HR 80 | Temp 97.3°F | Resp 16 | Ht 65.0 in | Wt 170.0 lb

## 2021-10-03 DIAGNOSIS — R0989 Other specified symptoms and signs involving the circulatory and respiratory systems: Secondary | ICD-10-CM

## 2021-10-03 DIAGNOSIS — I493 Ventricular premature depolarization: Secondary | ICD-10-CM

## 2021-10-03 DIAGNOSIS — E7800 Pure hypercholesterolemia, unspecified: Secondary | ICD-10-CM

## 2021-10-03 DIAGNOSIS — Z87891 Personal history of nicotine dependence: Secondary | ICD-10-CM

## 2021-10-03 DIAGNOSIS — I340 Nonrheumatic mitral (valve) insufficiency: Secondary | ICD-10-CM

## 2021-10-03 NOTE — Progress Notes (Signed)
Paige Carrillo Date of Birth: 1940-06-15 MRN: [id number] Primary Care Provider:Eksir, Earnest Conroy, MD Former Cardiology Providers: Dr. Vear Clock Primary Cardiologist: Rex Kras, DO, [facility] (established care 08/10/2019)  Date: 10/03/21 Last Office Visit: 10/19/2020  Chief Complaint  Patient presents with   Follow-up    1 year follow-up for mitral regurgitation and PVCs.    HPI  Paige Carrillo is a 81 y.o.  female whose past medical history and cardiovascular risk factors include: PVCs, mitral regurgitation, HLD, hypothyroidism , postmeanpasual female, advance age.   Patient presents today for 1 year follow-up visit for management of PVCs, mitral regurgitation and hypercholesterolemia.  Over the last 1 year she is done well from a cardiovascular standpoint but from orthopedic standpoint has had a compression fracture, herniated disc, and a pinched nerve.  Due to the orthopedic issues patient was recommended to decrease her physical activity with regards to walking.  She used to walk 7 to 10 miles per day or averaging 20,000 steps per day.  But now is getting back to the routine of walking up to 4 miles a day and using a stationary bike.  She has had labs and given her orthopedic conditions as noted above but no recent fasting lipid profile.  FUNCTIONAL STATUS: She walks 7-10 mile per day or 20,000 steps per day.     ALLERGIES: Allergies  Allergen Reactions   Darvon [Propoxyphene] Other (See Comments)    "Felt weird"     MEDICATION LIST PRIOR TO VISIT: Current Outpatient Medications on File Prior to Visit  Medication Sig Dispense Refill   aspirin EC 81 MG tablet Take 81 mg by mouth every other day. Every 3 days     cetirizine (ZYRTEC) 10 MG tablet Take 10 mg by mouth daily.     ezetimibe (ZETIA) 10 MG tablet TAKE 1 TABLET(10 MG) BY MOUTH DAILY 90 tablet 1   famotidine (PEPCID) 20 MG tablet Take 20 mg by mouth as needed for heartburn or indigestion.     fluticasone  (FLONASE) 50 MCG/ACT nasal spray Place 1 spray into both nostrils daily.     levothyroxine (SYNTHROID) 112 MCG tablet Take 125 mcg by mouth daily.     metoprolol tartrate (LOPRESSOR) 25 MG tablet Take 25 mg by mouth 2 (two) times daily.     nitroGLYCERIN (NITROSTAT) 0.4 MG SL tablet Place 1 tablet (0.4 mg total) under the tongue every 5 (five) minutes as needed for chest pain (up to 3 times). 21 tablet 0   pravastatin (PRAVACHOL) 40 MG tablet TAKE 1 TABLET(40 MG) BY MOUTH DAILY 90 tablet 1   PROAIR HFA 108 (90 Base) MCG/ACT inhaler Inhale 1-2 puffs into the lungs every 4 (four) hours as needed.  0   No current facility-administered medications on file prior to visit.    PAST MEDICAL HISTORY: Past Medical History:  Diagnosis Date   Hyperlipidemia    Hypothyroidism    Mitral regurgitation    PVC (premature ventricular contraction)     PAST SURGICAL HISTORY: History reviewed. No pertinent surgical history.  FAMILY HISTORY: The patient's family history includes Heart attack in her brother and father; Heart disease in her father.   SOCIAL HISTORY:  The patient  reports that she has quit smoking. Her smoking use included cigarettes. She has a 18.00 pack-year smoking history. She has never used smokeless tobacco. She reports that she does not drink alcohol and does not use drugs.  Review of Systems  Cardiovascular:  Negative for chest pain,  claudication, dyspnea on exertion, irregular heartbeat, leg swelling, near-syncope, orthopnea, palpitations, paroxysmal nocturnal dyspnea and syncope.  Respiratory:  Negative for shortness of breath.   Hematologic/Lymphatic: Negative for bleeding problem.  Musculoskeletal:  Negative for muscle cramps and myalgias.  Neurological:  Negative for dizziness and light-headedness.   PHYSICAL EXAM:    10/03/2021    1:04 PM 07/07/2021    7:52 PM 07/07/2021    6:55 PM  Vitals with BMI  Height 5' 5"    Weight 170 lbs    BMI 28.29    Systolic 126 144 150   Diastolic 58 63 75  Pulse 80 70 72    Physical Exam  Neck: No JVD present.  Cardiovascular: Normal rate, regular rhythm, S1 normal, S2 normal, intact distal pulses and normal pulses. Exam reveals no gallop, no S3 and no S4.  Murmur heard. Holosystolic murmur is present at the apex. Pulses:      Carotid pulses are  on the left side with bruit. Pulmonary/Chest: Effort normal and breath sounds normal. No stridor. She has no wheezes. She has no rales.  Abdominal: Soft. Bowel sounds are normal. She exhibits no distension. There is no abdominal tenderness.  Musculoskeletal:        General: No edema.     Cervical back: Neck supple.  Neurological: She is alert and oriented to person, place, and time.  Skin: Skin is warm and moist.   CARDIAC DATABASE: EKG: 10/03/2021: Normal sinus rhythm, 75 bpm, without underlying ischemia or injury pattern.  Echocardiogram: 10/10/2020:  Normal LV systolic function with visual EF 60-65%. Left ventricle cavity is normal in size. Normal left ventricular wall thickness. Normal global  wall motion. Normal diastolic filling pattern, normal LAP.  Mild (Grade I) mitral regurgitation.  Small pericardial effusion. There is no hemodynamic significance.  Compared to study 09/17/2019: Moderate MR is now mild and moderate LAE is  now within normal limits. Otherwise no significant change.   Stress Testing:  Lexiscan myoview stress test 03/29/2017: Perfusion imaging study demonstrates breast attenuation in the inferior wall, mild ischemia in the basal made and apical inferior wall could not be completely excluded however in view of the gated SPECT images reveal normal myocardial thickening and wall motion, most probably represents a soft tissue attenuation.  LVEF was normal at 59%.  This is a low risk study.  Lexiscan (Walking with mod Bruce)Tetrofosmin Stress Test  08/31/2019: Non-diagnostic ECG stress. Myocardial perfusion is normal. There is abnormal post stress  dilatation of the LV with TID index of 1.25. Overall LV systolic function is abnormal without regional wall motion abnormalities. Global hypokinesis of the left ventricle. Stress LV EF: 33%.  High risk study. Compared to the study done on 03/29/2017, previously diaphragmatic attenuation and normal LVEF.   LABORATORY DATA:    Latest Ref Rng & Units 02/19/2020    2:59 PM 03/27/2017    3:18 PM 07/06/2016    7:01 PM  CBC  WBC 4.0 - 10.5 K/uL 6.1  6.9  9.5   Hemoglobin 12.0 - 15.0 g/dL 13.6  14.4  13.8   Hematocrit 36.0 - 46.0 % 42.4  43.4  41.7   Platelets 150 - 400 K/uL 273  295  340        Latest Ref Rng & Units 10/27/2020    7:56 AM 02/19/2020    2:59 PM 09/29/2019    2:24 PM  CMP  Glucose 65 - 99 mg/dL 99  124  93   BUN 8 -   27 mg/dL 16  12  10   Creatinine 0.57 - 1.00 mg/dL 0.84  1.02  0.96   Sodium 134 - 144 mmol/L 141  140  138   Potassium 3.5 - 5.2 mmol/L 4.3  3.9  4.2   Chloride 96 - 106 mmol/L 105  105  102   CO2 20 - 29 mmol/L 24  27  23   Calcium 8.7 - 10.3 mg/dL 9.5  9.6  9.6   Total Protein 6.0 - 8.5 g/dL 6.4     Total Bilirubin 0.0 - 1.2 mg/dL 0.4     Alkaline Phos 44 - 121 IU/L 53     AST 0 - 40 IU/L 14     ALT 0 - 32 IU/L 13       Lipid Panel     Component Value Date/Time   CHOL 200 (H) 10/27/2020 0756   TRIG 94 10/27/2020 0756   HDL 58 10/27/2020 0756   LDLCALC 125 (H) 10/27/2020 0756   LDLDIRECT 109 (H) 10/27/2020 0756   LABVLDL 17 10/27/2020 0756    No results found for: "HGBA1C" No components found for: "NTPROBNP" No results found for: "TSH"  Cardiac Panel (last 3 results) No results for input(s): "CKTOTAL", "CKMB", "TROPONINIHS", "RELINDX" in the last 72 hours.  External Labs: Collected: 04/28/2021 available in Care Everywhere. Sodium 137, potassium 4.1, chloride 102, bicarb 25, BUN 18, creatinine 0.91. AST 13, ALT 11, alkaline phosphatase 51  IMPRESSION:    ICD-10-CM   1. Nonrheumatic mitral valve regurgitation  I34.0 EKG 12-Lead    PCV  ECHOCARDIOGRAM COMPLETE    2. PVC's (premature ventricular contractions)  I49.3     3. Hypercholesterolemia  E78.00 Lipid Panel With LDL/HDL Ratio    LDL cholesterol, direct    CMP14+EGFR    4. Former smoker  Z87.891     5. Bruit of left carotid artery  R09.89 PCV CAROTID DUPLEX (BILATERAL)       RECOMMENDATIONS: Zarina M Goodlin is a 80 y.o. female whose past medical history and cardiovascular risk factors include: History of PVCs, hyperlipidemia, nonrheumatic mild mitral regurgitation, former smoker, postmenopausal female, advanced age.  Nonrheumatic mitral valve regurgitation Recommend follow-up echo to reevaluate mitral regurgitation prior to the next office visit in 2024.  PVC's (premature ventricular contractions) Remains asymptomatic. Continue metoprolol tartrate. Prior echocardiograms have noted normal LVEF.  Hypercholesterolemia Currently on pravastatin and Zetia.   She denies myalgia or other side effects. No recent lipid profiles available for review. Patient states that it was recently checked with her other labs at an outside facility. Patient is asked to provide a copy to update our records and to see if medications need to be uptitrated.  We will still order a fasting lipid profile, direct LDL, and CMP just in case if her lipids were not checked.  Left carotid bruit: We will check a carotid duplex to evaluate for coronary artery atherosclerosis/stenosis Currently on aspirin, statin therapy, and Zetia. Further recommendations to follow.   FINAL MEDICATION LIST END OF ENCOUNTER: No orders of the defined types were placed in this encounter.   Medications Discontinued During This Encounter  Medication Reason   Fluticasone-Salmeterol (ADVAIR) 250-50 MCG/DOSE AEPB    lidocaine (LIDODERM) 5 %    metoprolol tartrate (LOPRESSOR) 25 MG tablet Patient Preference     Current Outpatient Medications:    aspirin EC 81 MG tablet, Take 81 mg by mouth every other  day. Every 3 days, Disp: , Rfl:    cetirizine (ZYRTEC)   10 MG tablet, Take 10 mg by mouth daily., Disp: , Rfl:    ezetimibe (ZETIA) 10 MG tablet, TAKE 1 TABLET(10 MG) BY MOUTH DAILY, Disp: 90 tablet, Rfl: 1   famotidine (PEPCID) 20 MG tablet, Take 20 mg by mouth as needed for heartburn or indigestion., Disp: , Rfl:    fluticasone (FLONASE) 50 MCG/ACT nasal spray, Place 1 spray into both nostrils daily., Disp: , Rfl:    levothyroxine (SYNTHROID) 112 MCG tablet, Take 125 mcg by mouth daily., Disp: , Rfl:    metoprolol tartrate (LOPRESSOR) 25 MG tablet, Take 25 mg by mouth 2 (two) times daily., Disp: , Rfl:    nitroGLYCERIN (NITROSTAT) 0.4 MG SL tablet, Place 1 tablet (0.4 mg total) under the tongue every 5 (five) minutes as needed for chest pain (up to 3 times)., Disp: 21 tablet, Rfl: 0   pravastatin (PRAVACHOL) 40 MG tablet, TAKE 1 TABLET(40 MG) BY MOUTH DAILY, Disp: 90 tablet, Rfl: 1   PROAIR HFA 108 (90 Base) MCG/ACT inhaler, Inhale 1-2 puffs into the lungs every 4 (four) hours as needed., Disp: , Rfl: 0  Orders Placed This Encounter  Procedures   Lipid Panel With LDL/HDL Ratio   LDL cholesterol, direct   CMP14+EGFR   EKG 12-Lead   PCV ECHOCARDIOGRAM COMPLETE   PCV CAROTID DUPLEX (BILATERAL)   --Continue cardiac medications as reconciled in final medication list. --Return in about 1 year (around 10/04/2022) for Follow up PVC/PACs, HLD, MR, Review test results. Or sooner if needed. --Continue follow-up with your primary care physician regarding the management of your other chronic comorbid conditions.  Patient's questions and concerns were addressed to her satisfaction. She voices understanding of the instructions provided during this encounter.   This note was created using a voice recognition software as a result there may be grammatical errors inadvertently enclosed that do not reflect the nature of this encounter. Every attempt is made to correct such errors.  Sunit Tolia, DO,  FACC  Pager: [phone] Office: [phone]  

## 2021-10-18 LAB — CMP14+EGFR
ALT: 12 IU/L (ref 0–32)
AST: 14 IU/L (ref 0–40)
Albumin/Globulin Ratio: 2.3 — ABNORMAL HIGH (ref 1.2–2.2)
Albumin: 4.5 g/dL (ref 3.8–4.8)
Alkaline Phosphatase: 87 IU/L (ref 44–121)
BUN/Creatinine Ratio: 19 (ref 12–28)
BUN: 17 mg/dL (ref 8–27)
Bilirubin Total: 0.3 mg/dL (ref 0.0–1.2)
CO2: 22 mmol/L (ref 20–29)
Calcium: 9.8 mg/dL (ref 8.7–10.3)
Chloride: 103 mmol/L (ref 96–106)
Creatinine, Ser: 0.89 mg/dL (ref 0.57–1.00)
Globulin, Total: 2 g/dL (ref 1.5–4.5)
Glucose: 111 mg/dL — ABNORMAL HIGH (ref 70–99)
Potassium: 4.5 mmol/L (ref 3.5–5.2)
Sodium: 140 mmol/L (ref 134–144)
Total Protein: 6.5 g/dL (ref 6.0–8.5)
eGFR: 66 mL/min/{1.73_m2} (ref >59)

## 2021-10-18 LAB — LIPID PANEL WITH LDL/HDL RATIO
Cholesterol, Total: 203 mg/dL — ABNORMAL HIGH (ref 100–199)
HDL: 59 mg/dL (ref >39)
LDL Chol Calc (NIH): 123 mg/dL — ABNORMAL HIGH (ref 0–99)
LDL/HDL Ratio: 2.1 ratio (ref 0.0–3.2)
Triglycerides: 118 mg/dL (ref 0–149)
VLDL Cholesterol Cal: 21 mg/dL (ref 5–40)

## 2021-10-18 LAB — LDL CHOLESTEROL, DIRECT: LDL Direct: 116 mg/dL — ABNORMAL HIGH (ref 0–99)

## 2021-11-02 ENCOUNTER — Other Ambulatory Visit: Payer: PPO

## 2021-11-02 ENCOUNTER — Ambulatory Visit: Payer: PPO

## 2021-11-02 DIAGNOSIS — R0989 Other specified symptoms and signs involving the circulatory and respiratory systems: Secondary | ICD-10-CM

## 2021-11-02 DIAGNOSIS — I6522 Occlusion and stenosis of left carotid artery: Secondary | ICD-10-CM

## 2021-11-03 NOTE — Progress Notes (Signed)
Called and spoke to patient she voiced understanding. Patient will think about it and will let us know. She has her repeat echo yesterday 11/02/21 do you need patient to come in to discuss results and medication therapy please advise

## 2021-11-15 NOTE — Progress Notes (Signed)
Called and spoke to patient she voiced understanding

## 2021-12-04 ENCOUNTER — Other Ambulatory Visit: Payer: Self-pay | Admitting: Cardiology

## 2021-12-20 ENCOUNTER — Other Ambulatory Visit: Payer: Self-pay | Admitting: Family Medicine

## 2021-12-20 DIAGNOSIS — Z1231 Encounter for screening mammogram for malignant neoplasm of breast: Secondary | ICD-10-CM

## 2021-12-21 ENCOUNTER — Ambulatory Visit
Admission: RE | Admit: 2021-12-21 | Discharge: 2021-12-21 | Disposition: A | Discharge status: Home / Self Care | Payer: PPO | Source: Ambulatory Visit | Attending: Family Medicine | Admitting: Family Medicine

## 2021-12-21 DIAGNOSIS — Z1231 Encounter for screening mammogram for malignant neoplasm of breast: Secondary | ICD-10-CM

## 2022-03-07 ENCOUNTER — Other Ambulatory Visit: Payer: Self-pay | Admitting: Cardiology

## 2022-05-07 ENCOUNTER — Other Ambulatory Visit: Payer: PPO

## 2022-06-04 ENCOUNTER — Other Ambulatory Visit: Payer: PPO

## 2022-07-17 ENCOUNTER — Ambulatory Visit: Payer: PPO

## 2022-07-17 DIAGNOSIS — I6522 Occlusion and stenosis of left carotid artery: Secondary | ICD-10-CM

## 2022-07-20 ENCOUNTER — Other Ambulatory Visit: Payer: Self-pay | Admitting: Cardiology

## 2022-07-20 DIAGNOSIS — I6523 Occlusion and stenosis of bilateral carotid arteries: Secondary | ICD-10-CM

## 2022-07-23 NOTE — Progress Notes (Signed)
Called and spoke with patient regarding her CAD resutls. Upcoming appointments (echo and FU) confirmed with patient.

## 2022-08-16 ENCOUNTER — Other Ambulatory Visit: Payer: Self-pay | Admitting: Cardiology

## 2022-08-31 ENCOUNTER — Other Ambulatory Visit: Payer: Self-pay | Admitting: Cardiology

## 2022-09-17 ENCOUNTER — Ambulatory Visit: Payer: PPO

## 2022-09-17 DIAGNOSIS — I340 Nonrheumatic mitral (valve) insufficiency: Secondary | ICD-10-CM

## 2022-10-01 NOTE — Progress Notes (Signed)
Called patient to inform her about her echo results. Patient will call back to make an appt in August.

## 2022-10-04 ENCOUNTER — Ambulatory Visit: Payer: PPO | Admitting: Cardiology

## 2022-11-26 ENCOUNTER — Ambulatory Visit: Payer: PPO | Admitting: Cardiology

## 2022-12-28 ENCOUNTER — Ambulatory Visit: Payer: PPO | Attending: Cardiology | Admitting: Cardiology

## 2022-12-28 ENCOUNTER — Encounter: Payer: Self-pay | Admitting: Cardiology

## 2022-12-28 VITALS — BP 100/64 | HR 64 | Resp 16 | Ht 65.0 in | Wt 163.6 lb

## 2022-12-28 DIAGNOSIS — I493 Ventricular premature depolarization: Secondary | ICD-10-CM | POA: Diagnosis not present

## 2022-12-28 DIAGNOSIS — I340 Nonrheumatic mitral (valve) insufficiency: Secondary | ICD-10-CM | POA: Diagnosis not present

## 2022-12-28 DIAGNOSIS — E7800 Pure hypercholesterolemia, unspecified: Secondary | ICD-10-CM

## 2022-12-28 DIAGNOSIS — Z87891 Personal history of nicotine dependence: Secondary | ICD-10-CM

## 2022-12-28 MED ORDER — PRAVASTATIN SODIUM 80 MG PO TABS: 80.0000 mg | ORAL_TABLET | Freq: Every evening | ORAL | 3 refills | Status: AC

## 2022-12-28 NOTE — Patient Instructions (Addendum)
Medication Instructions:  Your physician has recommended you make the following change in your medication:   1) INCREASE pravastatin (Pravachol) to 80 mg daily  *If you need a refill on your cardiac medications before your next appointment, please call your pharmacy*  Lab Work: In 6 weeks (at any [facility] office): FASTING lipid panel, CMP and direct LDL If you have labs (blood work) drawn today and your tests are completely normal, you will receive your results only by: MyChart Message (if you have MyChart) OR A paper copy in the mail If you have any lab test that is abnormal or we need to change your treatment, we will call you to review the results.  Testing/Procedures: None ordered today.  Follow-Up: At [facility], you and your health needs are our priority.  As part of our continuing mission to provide you with exceptional heart care, we have created designated Provider Care Teams.  These Care Teams include your primary Cardiologist (physician) and Advanced Practice Providers (APPs -  Physician Assistants and Nurse Practitioners) who all work together to provide you with the care you need, when you need it.  We recommend signing up for the patient portal called "MyChart".  Sign up information is provided on this After Visit Summary.  MyChart is used to connect with patients for Virtual Visits (Telemedicine).  Patients are able to view lab/test results, encounter notes, upcoming appointments, etc.  Non-urgent messages can be sent to your provider as well.   To learn more about what you can do with MyChart, go to [email or web address].    Your next appointment:   1 year(s)  The format for your next appointment:   In Person  Provider:   Tessa Lerner, DO

## 2022-12-28 NOTE — Progress Notes (Signed)
Cardiology Office Note:  .   Date:  12/28/2022  ID:  Paige Carrillo, DOB 1940-07-06, MRN [id number] PCP:  Gwenlyn Found, MD  Former Cardiology Providers: Dr. Florian Buff  [facility] Providers Cardiologist:  Tessa Lerner, DO , [facility] (established care 08/10/2019) Electrophysiologist:  None  Click to update primary MD,subspecialty MD or APP then REFRESH:1}    Chief Complaint  Patient presents with   Follow-up    1 year follow up - PVCs, MR, and HLD    History of Present Illness: .   Paige Carrillo is a 82 y.o.  female whose past medical history and cardiovascular risk factors includes: PVCs, mitral regurgitation, HLD, hypothyroidism , postmeanpasual female, advance age.   Patient presents today for 1 year follow-up given her history of PVC and mitral regurgitation and hypercholesterolemia.  Over the last 1 year patient states that she is doing well from a cardiovascular standpoint.  She is slowly recovering from her prior orthopedic comorbidities but it has impacted her overall physical endurance/activity level.  In the past used to walk 7 to 10 miles per day averaging 20,000 steps.  But now she is down to 4-5 miles per day and does her stationary bike.  She has not noticed any exertional chest pain or shortness of breath.  Repeat echocardiogram since last office visit notes stable LVEF and the severity of mitral regurgitation.  Most recent lipids independently reviewed with her at today's office visit and medications reconciled.   Review of Systems: .   Review of Systems  Cardiovascular:  Negative for chest pain, claudication, dyspnea on exertion, irregular heartbeat, leg swelling, near-syncope, orthopnea, palpitations, paroxysmal nocturnal dyspnea and syncope.  Respiratory:  Negative for shortness of breath.   Hematologic/Lymphatic: Negative for bleeding problem.  Musculoskeletal:  Negative for muscle cramps and myalgias.  Neurological:  Negative for  dizziness and light-headedness.    Studies Reviewed:   EKG: EKG Interpretation Date/Time:  Friday December 28 2022 08:53:42 EDT Ventricular Rate:  64 PR Interval:  192 QRS Duration:  74 QT Interval:  374 QTC Calculation: 385 R Axis:   21  Text Interpretation: Normal sinus rhythm Normal ECG When compared with ECG of 19-Feb-2020 14:34, No significant change was found Confirmed by Tessa Lerner [phone]) on 12/28/2022 9:31:55 AM  Echocardiogram: 10/10/2020: EF 60-65%. Normal diastolic filling pattern, normal LAP.  Mild MR, see report for additional details  09/17/2022:  Left ventricle cavity is normal in size. Mild concentric hypertrophy of  the left ventricle. Normal global wall motion. Normal LV systolic function  with EF 55%. Doppler evidence of grade I (impaired) diastolic dysfunction,  normal LAP.  Left atrial cavity is mildly dilated.  Mild (Grade I) mitral regurgitation. Mild mitral valve leaflet  calcification.  Mild tricuspid regurgitation.  Trace pericardial effusion.  No evidence of pulmonary hypertension.  No significant change compared to previous study on 10/10/2020.    Stress Testing:  Lexiscan (Walking with mod Bruce)Tetrofosmin Stress Test  08/31/2019: Non-diagnostic ECG stress. Myocardial perfusion is normal. There is abnormal post stress dilatation of the LV with TID index of 1.25. Overall LV systolic function is abnormal without regional wall motion abnormalities. Global hypokinesis of the left ventricle. Stress LV EF: 33%.  High risk study. Compared to the study done on 03/29/2017, previously diaphragmatic attenuation and normal LVEF.   Carotid artery duplex 07/17/2022:  Duplex suggests stenosis in the right internal carotid artery (1-15%).  Duplex suggests stenosis in the left internal carotid artery (minimal).  Antegrade  right vertebral artery flow. Antegrade left vertebral artery flow.  Compared to the study done on 11/02/2021, left ICA stenosis of 50 to 69%,  with peak velocity of 156/31 cm/s is no longer present.  Consider repeating study in 1 year.   RADIOLOGY: NA  Risk Assessment/Calculations:   NA   Labs:       Latest Ref Rng & Units 02/19/2020    2:59 PM 03/27/2017    3:18 PM 07/06/2016    7:01 PM  CBC  WBC 4.0 - 10.5 K/uL 6.1  6.9  9.5   Hemoglobin 12.0 - 15.0 g/dL 16.1  09.6  04.5   Hematocrit 36.0 - 46.0 % 42.4  43.4  41.7   Platelets 150 - 400 K/uL 273  295  340        Latest Ref Rng & Units 10/17/2021    8:24 AM 10/27/2020    7:56 AM 02/19/2020    2:59 PM  BMP  Glucose 70 - 99 mg/dL 409  99  811   BUN 8 - 27 mg/dL 17  16  12    Creatinine 0.57 - 1.00 mg/dL 9.14  7.82  9.56   BUN/Creat Ratio 12 - 28 19  19     Sodium 134 - 144 mmol/L 140  141  140   Potassium 3.5 - 5.2 mmol/L 4.5  4.3  3.9   Chloride 96 - 106 mmol/L 103  105  105   CO2 20 - 29 mmol/L 22  24  27    Calcium 8.7 - 10.3 mg/dL 9.8  9.5  9.6       Latest Ref Rng & Units 10/17/2021    8:24 AM 10/27/2020    7:56 AM 02/19/2020    2:59 PM  CMP  Glucose 70 - 99 mg/dL 213  99  086   BUN 8 - 27 mg/dL 17  16  12    Creatinine 0.57 - 1.00 mg/dL 5.78  4.69  6.29   Sodium 134 - 144 mmol/L 140  141  140   Potassium 3.5 - 5.2 mmol/L 4.5  4.3  3.9   Chloride 96 - 106 mmol/L 103  105  105   CO2 20 - 29 mmol/L 22  24  27    Calcium 8.7 - 10.3 mg/dL 9.8  9.5  9.6   Total Protein 6.0 - 8.5 g/dL 6.5  6.4    Total Bilirubin 0.0 - 1.2 mg/dL 0.3  0.4    Alkaline Phos 44 - 121 IU/L 87  53    AST 0 - 40 IU/L 14  14    ALT 0 - 32 IU/L 12  13      Lab Results  Component Value Date   CHOL 203 (H) 10/17/2021   HDL 59 10/17/2021   LDLCALC 123 (H) 10/17/2021   LDLDIRECT 116 (H) 10/17/2021   TRIG 118 10/17/2021   No results for input(s): "LIPOA" in the last 8760 hours. No components found for: "NTPROBNP" No results for input(s): "PROBNP" in the last 8760 hours. No results for input(s): "TSH" in the last 8760 hours.   Physical Exam:    Today's Vitals   12/28/22 0849   BP: 100/64  Pulse: 64  Resp: 16  SpO2: 95%  Weight: 163 lb 9.6 oz (74.2 kg)  Height: 5\' 5"  (1.651 m)   Body mass index is 27.22 kg/m. Wt Readings from Last 3 Encounters:  12/28/22 163 lb 9.6 oz (74.2 kg)  10/03/21 170 lb (77.1 kg)  07/07/21  166 lb 0.1 oz (75.3 kg)    Physical Exam  Constitutional: No distress.  hemodynamically stable  Neck: No JVD present.  Cardiovascular: Normal rate, regular rhythm, S1 normal, S2 normal, intact distal pulses and normal pulses. Exam reveals no gallop, no S3 and no S4.  Murmur heard. Holosystolic murmur is present with a grade of 3/6 at the apex. Pulmonary/Chest: Effort normal and breath sounds normal. No stridor. She has no wheezes. She has no rales.  Abdominal: Soft. Bowel sounds are normal. She exhibits no distension. There is no abdominal tenderness.  Musculoskeletal:        General: No edema.     Cervical back: Neck supple.  Neurological: She is alert and oriented to person, place, and time. She has intact cranial nerves (2-12).  Skin: Skin is warm and moist.    Impression & Recommendation(s):  Impression:   ICD-10-CM   1. Nonrheumatic mitral valve regurgitation  I34.0 EKG 12-Lead    2. PVC's (premature ventricular contractions)  I49.3     3. Hypercholesterolemia  E78.00 Lipid panel    Comprehensive metabolic panel    LDL cholesterol, direct    LDL cholesterol, direct    Comprehensive metabolic panel    Lipid panel    4. Former smoker  Z87.891        Recommendation(s):  Nonrheumatic mitral valve regurgitation Severity of mitral regurgitation remains stable. Clinically she has done well over the last 1 year. Continue to monitor her symptoms and reemphasize importance of blood pressure management  PVC's (premature ventricular contractions) EKG today shows sinus rhythm without underlying ischemia or injury pattern or ectopy. Continue Lopressor 25 mg p.o. twice daily  Hypercholesterolemia Currently on Zetia and  pravastatin.   She denies myalgia or other side effects. Most recent lipids dated August 2023, independently reviewed as noted above. Shared decision was to increase pravastatin to 80 mg p.o. nightly and repeat labs in 6 weeks to reevaluate lipids and CMP.  Patient recently underwent a carotid duplex in May 2024 noted a significant improvement in the left ICA disease burden from 50-69% to minimal stenosis.  Given the significant change in disease burden shared decision was to repeat a carotid duplex in May 2025 to confirm and/or refute these findings.  Patient is agreeable with the plan of care.  Orders Placed:  Orders Placed This Encounter  Procedures   Lipid panel    Standing Status:   Future    Number of Occurrences:   1    Standing Expiration Date:   12/28/2023   Comprehensive metabolic panel    Standing Status:   Future    Number of Occurrences:   1    Standing Expiration Date:   12/28/2023   LDL cholesterol, direct    Standing Status:   Future    Number of Occurrences:   1    Standing Expiration Date:   12/28/2023   EKG 12-Lead    As part of medical decision making lipids from August 2023, echocardiogram July 2024, carotid duplex from May 2024, EKG were independently reviewed during today's encounter.  Final Medication List:    Meds ordered this encounter  Medications   pravastatin (PRAVACHOL) 80 MG tablet    Sig: Take 1 tablet (80 mg total) by mouth every evening.    Dispense:  90 tablet    Refill:  3    Medications Discontinued During This Encounter  Medication Reason   fluticasone (FLONASE) 50 MCG/ACT nasal spray Patient Preference   pravastatin (  PRAVACHOL) 40 MG tablet Dose change     Current Outpatient Medications:    aspirin EC 81 MG tablet, Take 81 mg by mouth every other day. Every 3 days, Disp: , Rfl:    cetirizine (ZYRTEC) 10 MG tablet, Take 10 mg by mouth daily., Disp: , Rfl:    ezetimibe (ZETIA) 10 MG tablet, TAKE 1 TABLET(10 MG) BY MOUTH DAILY, Disp: 90  tablet, Rfl: 1   famotidine (PEPCID) 20 MG tablet, Take 20 mg by mouth as needed for heartburn or indigestion., Disp: , Rfl:    levothyroxine (SYNTHROID) 112 MCG tablet, Take 125 mcg by mouth daily., Disp: , Rfl:    metoprolol tartrate (LOPRESSOR) 25 MG tablet, Take 25 mg by mouth 2 (two) times daily., Disp: , Rfl:    nitroGLYCERIN (NITROSTAT) 0.4 MG SL tablet, Place 1 tablet (0.4 mg total) under the tongue every 5 (five) minutes as needed for chest pain (up to 3 times)., Disp: 21 tablet, Rfl: 0   pravastatin (PRAVACHOL) 80 MG tablet, Take 1 tablet (80 mg total) by mouth every evening., Disp: 90 tablet, Rfl: 3   PROAIR HFA 108 (90 Base) MCG/ACT inhaler, Inhale 1-2 puffs into the lungs every 4 (four) hours as needed., Disp: , Rfl: 0  Consent:   N/A  Disposition:   1 year follow-up for PVC, lipids, and MR management  Her questions and concerns were addressed to her satisfaction. She voices understanding of the recommendations provided during this encounter.    Signed, Tessa Lerner, DO, [facility]  [facility]  [address] [place], [place] [address] 12/28/2022 10:37 AM

## 2023-01-07 ENCOUNTER — Encounter: Payer: Self-pay | Admitting: Cardiology

## 2023-01-30 LAB — LIPID PANEL
Chol/HDL Ratio: 3.3 {ratio} (ref 0.0–4.4)
Cholesterol, Total: 187 mg/dL (ref 100–199)
HDL: 57 mg/dL (ref >39)
LDL Chol Calc (NIH): 111 mg/dL — ABNORMAL HIGH (ref 0–99)
Triglycerides: 108 mg/dL (ref 0–149)
VLDL Cholesterol Cal: 19 mg/dL (ref 5–40)

## 2023-01-30 LAB — COMPREHENSIVE METABOLIC PANEL
ALT: 12 [IU]/L (ref 0–32)
AST: 13 [IU]/L (ref 0–40)
Albumin: 4.2 g/dL (ref 3.7–4.7)
Alkaline Phosphatase: 69 [IU]/L (ref 44–121)
BUN/Creatinine Ratio: 19 (ref 12–28)
BUN: 16 mg/dL (ref 8–27)
Bilirubin Total: 0.4 mg/dL (ref 0.0–1.2)
CO2: 21 mmol/L (ref 20–29)
Calcium: 9.4 mg/dL (ref 8.7–10.3)
Chloride: 106 mmol/L (ref 96–106)
Creatinine, Ser: 0.84 mg/dL (ref 0.57–1.00)
Globulin, Total: 2.2 g/dL (ref 1.5–4.5)
Glucose: 105 mg/dL — ABNORMAL HIGH (ref 70–99)
Potassium: 4.4 mmol/L (ref 3.5–5.2)
Sodium: 140 mmol/L (ref 134–144)
Total Protein: 6.4 g/dL (ref 6.0–8.5)
eGFR: 70 mL/min/{1.73_m2} (ref >59)

## 2023-01-30 LAB — LDL CHOLESTEROL, DIRECT: LDL Direct: 106 mg/dL — ABNORMAL HIGH (ref 0–99)

## 2023-02-21 ENCOUNTER — Other Ambulatory Visit: Payer: Self-pay

## 2023-02-21 ENCOUNTER — Encounter (HOSPITAL_BASED_OUTPATIENT_CLINIC_OR_DEPARTMENT_OTHER): Payer: Self-pay

## 2023-02-21 ENCOUNTER — Emergency Department (HOSPITAL_BASED_OUTPATIENT_CLINIC_OR_DEPARTMENT_OTHER): Payer: PPO | Admitting: Radiology

## 2023-02-21 ENCOUNTER — Emergency Department (HOSPITAL_BASED_OUTPATIENT_CLINIC_OR_DEPARTMENT_OTHER)
Admission: EM | Admit: 2023-02-21 | Discharge: 2023-02-21 | Disposition: A | Discharge status: Home / Self Care | Payer: PPO

## 2023-02-21 DIAGNOSIS — R0602 Shortness of breath: Secondary | ICD-10-CM | POA: Diagnosis not present

## 2023-02-21 DIAGNOSIS — R079 Chest pain, unspecified: Secondary | ICD-10-CM | POA: Diagnosis present

## 2023-02-21 DIAGNOSIS — Z7982 Long term (current) use of aspirin: Secondary | ICD-10-CM | POA: Insufficient documentation

## 2023-02-21 LAB — BASIC METABOLIC PANEL
Anion gap: 7 (ref 5–15)
BUN: 17 mg/dL (ref 8–23)
CO2: 27 mmol/L (ref 22–32)
Calcium: 9.6 mg/dL (ref 8.9–10.3)
Chloride: 102 mmol/L (ref 98–111)
Creatinine, Ser: 0.9 mg/dL (ref 0.44–1.00)
GFR, Estimated: >60 mL/min (ref >60)
Glucose, Bld: 105 mg/dL — ABNORMAL HIGH (ref 70–99)
Potassium: 3.9 mmol/L (ref 3.5–5.1)
Sodium: 136 mmol/L (ref 135–145)

## 2023-02-21 LAB — CBC
HCT: 40.9 % (ref 36.0–46.0)
Hemoglobin: 13.7 g/dL (ref 12.0–15.0)
MCH: 31.9 pg (ref 26.0–34.0)
MCHC: 33.5 g/dL (ref 30.0–36.0)
MCV: 95.1 fL (ref 80.0–100.0)
Platelets: 283 10*3/uL (ref 150–400)
RBC: 4.3 MIL/uL (ref 3.87–5.11)
RDW: 13.4 % (ref 11.5–15.5)
WBC: 6.5 10*3/uL (ref 4.0–10.5)
nRBC: 0 % (ref 0.0–0.2)

## 2023-02-21 LAB — TROPONIN I (HIGH SENSITIVITY)
Troponin I (High Sensitivity): 6 ng/L (ref <18)
Troponin I (High Sensitivity): 6 ng/L (ref <18)

## 2023-02-21 NOTE — ED Notes (Signed)
Discharge paperwork given and verbally understood. 

## 2023-02-21 NOTE — ED Triage Notes (Signed)
Pt states "I'm just not feeling great." Discomfort in L side of chest following bra-line, nonsustained, no radiation. Advises she took regular ASA, "& I feel a little better now but just want to be sure."  States she went to the gym just prior to CP & had no issue. "I walked like 3mi this morning w no issue."

## 2023-02-21 NOTE — Discharge Instructions (Addendum)
Please follow-up with your cardiologist.  Return immediately to the ED if you develope fevers, chills, worsening chest pain, shortness of breath, abdominal pain, feel your heart racing, you pass out or he develop any new or worsening symptoms that are concerning to you.

## 2023-02-21 NOTE — ED Provider Notes (Signed)
[place] EMERGENCY DEPARTMENT AT [facility] Provider Note   CSN: [id number] Arrival date & time: 02/21/23  1410     History  Chief Complaint  Patient presents with   Chest Pain   Shortness of Breath    Paige Carrillo is a 82 y.o. female.  This is an 82 year old female presenting emergency department with episode of chest pain, and shortness of breath.  Reports she has history of PVCs.  She has had similar episodes in the past that were short-lived.  She had dull sensation in the left side of chest.  Nonradiating.  Went to the gym and walked 3 miles with no change in symptoms.  She had some mild shortness of breath.  No lightheadedness, dizziness, passing out.  No nausea no vomiting.  Reports symptoms subsided on their own and she is currently symptom-free.  She does note that she has had some emotional stressors recently in life.   Chest Pain Associated symptoms: shortness of breath   Shortness of Breath Associated symptoms: chest pain        Home Medications Prior to Admission medications   Medication Sig Start Date End Date Taking? Authorizing Provider  aspirin EC 81 MG tablet Take 81 mg by mouth every other day. Every 3 days    [provider]  cetirizine (ZYRTEC) 10 MG tablet Take 10 mg by mouth daily.    [provider]  ezetimibe (ZETIA) 10 MG tablet TAKE 1 TABLET(10 MG) BY MOUTH DAILY 08/16/22   Tolia, Sunit, DO  famotidine (PEPCID) 20 MG tablet Take 20 mg by mouth as needed for heartburn or indigestion.    [provider]  levothyroxine (SYNTHROID) 112 MCG tablet Take 125 mcg by mouth daily. 09/06/20   [provider]  metoprolol tartrate (LOPRESSOR) 25 MG tablet Take 25 mg by mouth 2 (two) times daily.    [provider]  nitroGLYCERIN (NITROSTAT) 0.4 MG SL tablet Place 1 tablet (0.4 mg total) under the tongue every 5 (five) minutes as needed for chest pain (up to 3 times). 03/27/17   Tegeler, Canary Brim,  MD  pravastatin (PRAVACHOL) 80 MG tablet Take 1 tablet (80 mg total) by mouth every evening. 12/28/22 03/28/23  Tolia, Sunit, DO  PROAIR HFA 108 (90 Base) MCG/ACT inhaler Inhale 1-2 puffs into the lungs every 4 (four) hours as needed. 03/08/15   [provider]      Allergies    Darvon [propoxyphene]    Review of Systems   Review of Systems  Respiratory:  Positive for shortness of breath.   Cardiovascular:  Positive for chest pain.    Physical Exam Updated Vital Signs BP (!) 177/67 (BP Location: Right Arm) Comment: Provider aware and cleared  Pulse 62   Temp 97.9 F (36.6 C)   Resp 16   SpO2 99%  Physical Exam Vitals and nursing note reviewed.  Constitutional:      General: She is not in acute distress.    Appearance: She is not toxic-appearing.  HENT:     Head: Normocephalic.  Cardiovascular:     Rate and Rhythm: Normal rate and regular rhythm.     Heart sounds: Normal heart sounds.  Pulmonary:     Effort: Pulmonary effort is normal.     Breath sounds: Normal breath sounds.  Abdominal:     Palpations: Abdomen is soft.  Musculoskeletal:        General: Normal range of motion.     Right lower leg: No  edema.     Left lower leg: No edema.  Skin:    General: Skin is warm.     Capillary Refill: Capillary refill takes less than 2 seconds.  Neurological:     Mental Status: She is alert and oriented to person, place, and time.  Psychiatric:        Mood and Affect: Mood normal.        Behavior: Behavior normal.     ED Results / Procedures / Treatments   Labs (all labs ordered are listed, but only abnormal results are displayed) Labs Reviewed  BASIC METABOLIC PANEL - Abnormal; Notable for the following components:      Result Value   Glucose, Bld 105 (*)    All other components within normal limits  CBC  TROPONIN I (HIGH SENSITIVITY)  TROPONIN I (HIGH SENSITIVITY)    EKG EKG Interpretation Date/Time:  Thursday February 21 2023 14:19:26 EST Ventricular  Rate:  75 PR Interval:  184 QRS Duration:  76 QT Interval:  350 QTC Calculation: 390 R Axis:   58  Text Interpretation: Normal sinus rhythm Low voltage QRS Borderline ECG When compared with ECG of 28-Dec-2022 08:53, No significant change was found No significant change since last tracing Confirmed by Jacalyn Lefevre [phone]) on 02/21/2023 2:24:43 PM  Radiology DG Chest 2 View Result Date: 02/21/2023 CLINICAL DATA:  Chest pain. EXAM: CHEST - 2 VIEW COMPARISON:  March 27, 2017. FINDINGS: The heart size and mediastinal contours are within normal limits. Both lungs are clear. The visualized skeletal structures are unremarkable. IMPRESSION: No active cardiopulmonary disease. Aortic Atherosclerosis (ICD10-I70.0). Electronically Signed   By: Lupita Raider M.D.   On: 02/21/2023 18:00    Procedures Procedures    Medications Ordered in ED Medications - No data to display  ED Course/ Medical Decision Making/ A&P                                 Medical Decision Making This is a well-appearing 82 year old female presenting emergency department for transient left-sided chest pain, shortness of breath.  She is afebrile vital signs reassuring.  Physical exam is well-appearing with equal pulses.  Not in respiratory distress.  On my independent interpretation appears to be normal sinus rhythm with a rate in the mid 70s.  EKG independent interpreted by [facility] appears to be normal sinus rhythm at a rate of 75 bpm.  No ectopy.  Normal intervals.  No ST segment changes to indicate ischemia.  QTc 390.  Troponin negative.  Awaiting delta troponin.  Chest x-ray independently reviewed by myself.  No pneumothorax, widened mediastinum.  No overt pneumonia.  Awaiting radiology final read.  CBC with no leukocytosis to suggest systemic infection.  No anemia that would cause symptoms.  She has no metabolic derangements.  Glucose within normal limits.  Normal renal function.  Doubt metabolic source.  She is low risk for  PE based on Wells criteria; no tachycardia, no hypoxia.  Patient is currently asymptomatic.  Per chart review she does follow with cardiology does have some mitral valve regurg, but has had negative stress test in the past. If the patient's second troponin negative will discharge with cardiology follow-up.  Amount and/or Complexity of Data Reviewed Labs: ordered. Radiology: ordered.          Final Clinical Impression(s) / ED Diagnoses Final diagnoses:  Chest pain, unspecified type    Rx / DC Orders ED  Discharge Orders     None         Coral Spikes, [place] 02/21/23 1942

## 2023-03-03 ENCOUNTER — Other Ambulatory Visit: Payer: Self-pay | Admitting: Cardiology

## 2023-05-11 ENCOUNTER — Other Ambulatory Visit: Payer: Self-pay | Admitting: Cardiology

## 2024-02-11 ENCOUNTER — Other Ambulatory Visit: Payer: Self-pay | Admitting: Cardiology

## 2024-02-12 MED ORDER — EZETIMIBE 10 MG PO TABS: 10.0000 mg | ORAL_TABLET | Freq: Every day | ORAL | 0 refills | Status: AC

## 2024-03-11 ENCOUNTER — Other Ambulatory Visit: Payer: Self-pay | Admitting: Cardiology

## 2024-03-27 ENCOUNTER — Other Ambulatory Visit: Payer: Self-pay | Admitting: Cardiology
